# Patient Record
Sex: Female | Born: 1960 | Race: White | Hispanic: No | Smoking: Current every day smoker
Health system: Southern US, Community
[De-identification: ages and names within clinical notes are randomized; demographics above are authoritative.]

## PROBLEM LIST (undated history)

## (undated) DIAGNOSIS — M549 Dorsalgia, unspecified: Secondary | ICD-10-CM

## (undated) DIAGNOSIS — F4310 Post-traumatic stress disorder, unspecified: Secondary | ICD-10-CM

## (undated) DIAGNOSIS — M797 Fibromyalgia: Secondary | ICD-10-CM

## (undated) DIAGNOSIS — B009 Herpesviral infection, unspecified: Secondary | ICD-10-CM

## (undated) DIAGNOSIS — M1990 Unspecified osteoarthritis, unspecified site: Secondary | ICD-10-CM

## (undated) DIAGNOSIS — N879 Dysplasia of cervix uteri, unspecified: Secondary | ICD-10-CM

## (undated) DIAGNOSIS — G8929 Other chronic pain: Secondary | ICD-10-CM

## (undated) DIAGNOSIS — B029 Zoster without complications: Secondary | ICD-10-CM

## (undated) HISTORY — DX: Zoster without complications: B02.9

## (undated) HISTORY — DX: Unspecified osteoarthritis, unspecified site: M19.90

## (undated) HISTORY — DX: Fibromyalgia: M79.7

---

## 1989-11-10 HISTORY — PX: TUBAL LIGATION: SHX77

## 1991-11-11 HISTORY — PX: ABDOMINAL HYSTERECTOMY: SHX81

## 1999-07-15 ENCOUNTER — Emergency Department (HOSPITAL_COMMUNITY): Admission: EM | Admit: 1999-07-15 | Discharge: 1999-07-15 | Payer: Self-pay | Admitting: Emergency Medicine

## 2001-05-15 ENCOUNTER — Emergency Department (HOSPITAL_COMMUNITY): Admission: EM | Admit: 2001-05-15 | Discharge: 2001-05-15 | Payer: Self-pay | Admitting: Emergency Medicine

## 2001-09-13 ENCOUNTER — Ambulatory Visit (HOSPITAL_COMMUNITY): Admission: RE | Admit: 2001-09-13 | Discharge: 2001-09-13 | Payer: Self-pay | Admitting: General Surgery

## 2001-09-13 ENCOUNTER — Encounter: Payer: Self-pay | Admitting: General Surgery

## 2002-02-22 ENCOUNTER — Ambulatory Visit (HOSPITAL_COMMUNITY): Admission: RE | Admit: 2002-02-22 | Discharge: 2002-02-22 | Payer: Self-pay | Admitting: General Surgery

## 2002-02-22 ENCOUNTER — Encounter: Payer: Self-pay | Admitting: General Surgery

## 2004-02-05 ENCOUNTER — Emergency Department (HOSPITAL_COMMUNITY): Admission: EM | Admit: 2004-02-05 | Discharge: 2004-02-06 | Payer: Self-pay | Admitting: Emergency Medicine

## 2004-04-08 ENCOUNTER — Emergency Department (HOSPITAL_COMMUNITY): Admission: EM | Admit: 2004-04-08 | Discharge: 2004-04-09 | Payer: Self-pay | Admitting: *Deleted

## 2004-07-22 ENCOUNTER — Emergency Department (HOSPITAL_COMMUNITY): Admission: EM | Admit: 2004-07-22 | Discharge: 2004-07-22 | Payer: Self-pay | Admitting: Family Medicine

## 2006-04-17 ENCOUNTER — Ambulatory Visit (HOSPITAL_COMMUNITY): Admission: RE | Admit: 2006-04-17 | Discharge: 2006-04-17 | Payer: Self-pay | Admitting: Family Medicine

## 2006-08-03 ENCOUNTER — Emergency Department (HOSPITAL_COMMUNITY): Admission: EM | Admit: 2006-08-03 | Discharge: 2006-08-03 | Payer: Self-pay | Admitting: Emergency Medicine

## 2011-06-04 ENCOUNTER — Emergency Department (HOSPITAL_COMMUNITY)
Admission: EM | Admit: 2011-06-04 | Discharge: 2011-06-05 | Disposition: A | Discharge status: Home / Self Care | Payer: Self-pay | Attending: Emergency Medicine | Admitting: Emergency Medicine

## 2011-06-04 DIAGNOSIS — R5383 Other fatigue: Secondary | ICD-10-CM | POA: Insufficient documentation

## 2011-06-04 DIAGNOSIS — R195 Other fecal abnormalities: Secondary | ICD-10-CM | POA: Insufficient documentation

## 2011-06-04 DIAGNOSIS — R5381 Other malaise: Secondary | ICD-10-CM | POA: Insufficient documentation

## 2011-06-04 DIAGNOSIS — R110 Nausea: Secondary | ICD-10-CM | POA: Insufficient documentation

## 2011-06-04 DIAGNOSIS — T148 Other injury of unspecified body region: Secondary | ICD-10-CM | POA: Insufficient documentation

## 2011-06-04 DIAGNOSIS — R51 Headache: Secondary | ICD-10-CM | POA: Insufficient documentation

## 2011-06-04 DIAGNOSIS — R21 Rash and other nonspecific skin eruption: Secondary | ICD-10-CM | POA: Insufficient documentation

## 2011-06-04 DIAGNOSIS — W57XXXA Bitten or stung by nonvenomous insect and other nonvenomous arthropods, initial encounter: Secondary | ICD-10-CM | POA: Insufficient documentation

## 2011-06-05 ENCOUNTER — Emergency Department (HOSPITAL_COMMUNITY): Payer: Self-pay

## 2011-06-05 LAB — CBC
HCT: 37.6 % (ref 36.0–46.0)
Hemoglobin: 12.9 g/dL (ref 12.0–15.0)
RDW: 11.7 % (ref 11.5–15.5)
WBC: 6.2 10*3/uL (ref 4.0–10.5)

## 2011-06-05 LAB — URINALYSIS, ROUTINE W REFLEX MICROSCOPIC
Glucose, UA: NEGATIVE mg/dL
Hgb urine dipstick: NEGATIVE
Leukocytes, UA: NEGATIVE
Specific Gravity, Urine: 1.017 (ref 1.005–1.030)
pH: 6 (ref 5.0–8.0)

## 2011-06-05 LAB — COMPREHENSIVE METABOLIC PANEL
ALT: 17 U/L (ref 0–35)
Albumin: 4 g/dL (ref 3.5–5.2)
Alkaline Phosphatase: 81 U/L (ref 39–117)
BUN: 8 mg/dL (ref 6–23)
Chloride: 102 mEq/L (ref 96–112)
Potassium: 3.5 mEq/L (ref 3.5–5.1)
Sodium: 139 mEq/L (ref 135–145)
Total Bilirubin: 0.2 mg/dL — ABNORMAL LOW (ref 0.3–1.2)
Total Protein: 7.3 g/dL (ref 6.0–8.3)

## 2011-06-05 LAB — DIFFERENTIAL
Basophils Absolute: 0 10*3/uL (ref 0.0–0.1)
Basophils Relative: 1 % (ref 0–1)
Eosinophils Relative: 2 % (ref 0–5)
Lymphocytes Relative: 45 % (ref 12–46)
Neutro Abs: 3 10*3/uL (ref 1.7–7.7)

## 2011-06-05 LAB — LIPASE, BLOOD: Lipase: 31 U/L (ref 11–59)

## 2011-06-06 LAB — B. BURGDORFI ANTIBODIES: B burgdorferi Ab IgG+IgM: 0.33 {ISR}

## 2011-06-06 LAB — ROCKY MTN SPOTTED FVR AB, IGG-BLOOD: RMSF IgG: 0.45 IV

## 2011-06-06 LAB — ROCKY MTN SPOTTED FVR AB, IGM-BLOOD: RMSF IgM: 0.19 IV (ref 0.00–0.89)

## 2011-10-28 ENCOUNTER — Emergency Department (HOSPITAL_COMMUNITY)
Admission: EM | Admit: 2011-10-28 | Discharge: 2011-10-28 | Disposition: A | Discharge status: Home / Self Care | Payer: No Typology Code available for payment source | Attending: Emergency Medicine | Admitting: Emergency Medicine

## 2011-10-28 ENCOUNTER — Encounter: Payer: Self-pay | Admitting: Emergency Medicine

## 2011-10-28 ENCOUNTER — Emergency Department (HOSPITAL_COMMUNITY): Payer: No Typology Code available for payment source

## 2011-10-28 DIAGNOSIS — M62838 Other muscle spasm: Secondary | ICD-10-CM | POA: Insufficient documentation

## 2011-10-28 DIAGNOSIS — S161XXA Strain of muscle, fascia and tendon at neck level, initial encounter: Secondary | ICD-10-CM

## 2011-10-28 DIAGNOSIS — R209 Unspecified disturbances of skin sensation: Secondary | ICD-10-CM | POA: Insufficient documentation

## 2011-10-28 DIAGNOSIS — Y92410 Unspecified street and highway as the place of occurrence of the external cause: Secondary | ICD-10-CM | POA: Insufficient documentation

## 2011-10-28 DIAGNOSIS — S139XXA Sprain of joints and ligaments of unspecified parts of neck, initial encounter: Secondary | ICD-10-CM | POA: Insufficient documentation

## 2011-10-28 HISTORY — DX: Herpesviral infection, unspecified: B00.9

## 2011-10-28 MED ORDER — CYCLOBENZAPRINE HCL 10 MG PO TABS
5.0000 mg | ORAL_TABLET | Freq: Once | ORAL | Status: AC
  Administered 2011-10-28: 5 mg via ORAL
  Filled 2011-10-28 (×2): qty 1

## 2011-10-28 MED ORDER — CYCLOBENZAPRINE HCL 5 MG PO TABS: 5.0000 mg | ORAL_TABLET | Freq: Three times a day (TID) | ORAL | Status: DC | PRN

## 2011-10-28 MED ORDER — KETOROLAC TROMETHAMINE 60 MG/2ML IM SOLN
60.0000 mg | Freq: Once | INTRAMUSCULAR | Status: AC
  Administered 2011-10-28: 60 mg via INTRAMUSCULAR
  Filled 2011-10-28: qty 2

## 2011-10-28 MED ORDER — HYDROCODONE-ACETAMINOPHEN 5-325 MG PO TABS: 1.0000 | ORAL_TABLET | ORAL | Status: AC | PRN

## 2011-10-28 MED ORDER — HYDROCODONE-ACETAMINOPHEN 5-325 MG PO TABS
1.0000 | ORAL_TABLET | Freq: Once | ORAL | Status: AC
  Administered 2011-10-28: 1 via ORAL
  Filled 2011-10-28: qty 1

## 2011-10-28 NOTE — ED Notes (Signed)
Woke during this noc with neck and shoulder pain
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
## 2011-10-30 ENCOUNTER — Encounter (HOSPITAL_COMMUNITY): Payer: Self-pay | Admitting: Emergency Medicine

## 2011-10-30 ENCOUNTER — Emergency Department (HOSPITAL_COMMUNITY)
Admission: EM | Admit: 2011-10-30 | Discharge: 2011-10-30 | Disposition: A | Discharge status: Home / Self Care | Payer: No Typology Code available for payment source | Attending: Emergency Medicine | Admitting: Emergency Medicine

## 2011-10-30 DIAGNOSIS — Z9851 Tubal ligation status: Secondary | ICD-10-CM | POA: Insufficient documentation

## 2011-10-30 DIAGNOSIS — F4310 Post-traumatic stress disorder, unspecified: Secondary | ICD-10-CM | POA: Insufficient documentation

## 2011-10-30 DIAGNOSIS — S161XXA Strain of muscle, fascia and tendon at neck level, initial encounter: Secondary | ICD-10-CM

## 2011-10-30 DIAGNOSIS — F17200 Nicotine dependence, unspecified, uncomplicated: Secondary | ICD-10-CM | POA: Insufficient documentation

## 2011-10-30 DIAGNOSIS — S139XXA Sprain of joints and ligaments of unspecified parts of neck, initial encounter: Secondary | ICD-10-CM | POA: Insufficient documentation

## 2011-10-30 DIAGNOSIS — Z9079 Acquired absence of other genital organ(s): Secondary | ICD-10-CM | POA: Insufficient documentation

## 2011-10-30 DIAGNOSIS — B009 Herpesviral infection, unspecified: Secondary | ICD-10-CM | POA: Insufficient documentation

## 2011-10-30 DIAGNOSIS — Y92410 Unspecified street and highway as the place of occurrence of the external cause: Secondary | ICD-10-CM | POA: Insufficient documentation

## 2011-10-30 HISTORY — DX: Dysplasia of cervix uteri, unspecified: N87.9

## 2011-10-30 HISTORY — DX: Post-traumatic stress disorder, unspecified: F43.10

## 2011-10-30 MED ORDER — IBUPROFEN 800 MG PO TABS
800.0000 mg | ORAL_TABLET | Freq: Once | ORAL | Status: AC
  Administered 2011-10-30: 800 mg via ORAL
  Filled 2011-10-30: qty 1

## 2011-10-30 MED ORDER — IBUPROFEN 800 MG PO TABS: 800.0000 mg | ORAL_TABLET | Freq: Three times a day (TID) | ORAL | Status: AC

## 2011-10-30 MED ORDER — DIAZEPAM 5 MG PO TABS
5.0000 mg | ORAL_TABLET | Freq: Once | ORAL | Status: AC
  Administered 2011-10-30: 5 mg via ORAL
  Filled 2011-10-30: qty 1

## 2011-10-30 MED ORDER — HYDROCODONE-ACETAMINOPHEN 5-325 MG PO TABS
2.0000 | ORAL_TABLET | Freq: Once | ORAL | Status: AC
  Administered 2011-10-30: 2 via ORAL
  Filled 2011-10-30: qty 2

## 2011-10-30 MED ORDER — DIAZEPAM 5 MG PO TABS: 5.0000 mg | ORAL_TABLET | Freq: Every day | ORAL | Status: AC
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
## 2011-10-30 NOTE — ED Notes (Signed)
Ice given to apply to neck area.
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
## 2011-10-30 NOTE — ED Notes (Signed)
Patient reports taking vicidon and flexeril 2 hours ago with no relief.

## 2011-10-31 NOTE — ED Provider Notes (Signed)
Medical screening examination/treatment/procedure(s) were performed by non-physician practitioner and as supervising physician I was immediately available for consultation/collaboration.  Ashrita Chrismer K Linker, MD 10/31/11 1633 

## 2012-03-23 ENCOUNTER — Encounter (HOSPITAL_COMMUNITY): Payer: Self-pay | Admitting: *Deleted

## 2012-03-23 ENCOUNTER — Emergency Department (HOSPITAL_COMMUNITY): Payer: Self-pay

## 2012-03-23 ENCOUNTER — Emergency Department (HOSPITAL_COMMUNITY)
Admission: EM | Admit: 2012-03-23 | Discharge: 2012-03-23 | Disposition: A | Discharge status: Home / Self Care | Payer: Self-pay | Attending: Emergency Medicine | Admitting: Emergency Medicine

## 2012-03-23 DIAGNOSIS — M545 Low back pain, unspecified: Secondary | ICD-10-CM

## 2012-03-23 DIAGNOSIS — F17200 Nicotine dependence, unspecified, uncomplicated: Secondary | ICD-10-CM | POA: Insufficient documentation

## 2012-03-23 DIAGNOSIS — Z79899 Other long term (current) drug therapy: Secondary | ICD-10-CM | POA: Insufficient documentation

## 2012-03-23 HISTORY — DX: Other chronic pain: G89.29

## 2012-03-23 HISTORY — DX: Dorsalgia, unspecified: M54.9

## 2012-03-23 LAB — URINALYSIS, ROUTINE W REFLEX MICROSCOPIC
Glucose, UA: NEGATIVE mg/dL
Ketones, ur: NEGATIVE mg/dL
Leukocytes, UA: NEGATIVE
Protein, ur: NEGATIVE mg/dL
Urobilinogen, UA: 0.2 mg/dL (ref 0.0–1.0)

## 2012-03-23 MED ORDER — METHOCARBAMOL 500 MG PO TABS: 1000.0000 mg | ORAL_TABLET | Freq: Four times a day (QID) | ORAL | Status: AC | PRN

## 2012-03-23 MED ORDER — HYDROMORPHONE HCL PF 1 MG/ML IJ SOLN
1.0000 mg | Freq: Once | INTRAMUSCULAR | Status: AC
  Administered 2012-03-23: 1 mg via INTRAMUSCULAR
  Filled 2012-03-23: qty 1

## 2012-03-23 MED ORDER — HYDROCODONE-ACETAMINOPHEN 5-325 MG PO TABS: ORAL_TABLET | ORAL | Status: AC

## 2012-03-23 NOTE — ED Notes (Signed)
Pt reports having low back pain for 10 days, denies any known injury.

## 2012-03-23 NOTE — ED Notes (Signed)
Pain rt hip and low back, x 10 days

## 2012-03-23 NOTE — Discharge Instructions (Signed)
RESOURCE GUIDE  Dental Problems  Patients with Medicaid: [facility]                     [facility] [address]                                           [address] Phone:  [phone]                                                  Phone:  [phone]  If unable to pay or uninsured, contact:  [facility] or [facility] to become qualified for the adult dental clinic.  Chronic Pain Problems Contact [facility]  [phone] Patients need to be referred by their primary care doctor.  Insufficient Money for Medicine Contact [employer]:  call "211" or [facility] 271-5999.  No Primary Care Doctor Call [facility]  832-8000 Other agencies that provide inexpensive medical care    [facility]  [phone]    [facility]  [phone]    [facility]  [phone]    Women[facility] Clinic  832-4777    [facility]  [phone]    [facility]  [phone]  Psychological Services [facility]  [phone] [facility]  [phone] [facility]   800 853-5163 (emergency services 641-4993)  Substance Abuse Resources [facility]  336-882-2125 [facility] 336-784-9470 [facility] 336-285-9073 [facility] 336-845-3988 Residential & Outpatient Substance Abuse Program  800-659-3381  Abuse/Neglect [place] Child Abuse Hotline (336) 641-3795 [place] Child Abuse Hotline 800-378-5315 (After Hours)  Emergency Shelter [facility] (336) 271-5985  [facility] (336) 275-9566 [facility] (704) 372-4663  MRSA Hotline #:   832-7006    [place] [facility]  [facility] [place]     [employer]                          [facility] [address] [place]                       [address]      [address]  [place]                                                [place]                            [place] Phone:  [phone]                                   Phone:  [phone]                 Phone:  [phone]  [facility] Phone:  342-8316    [place] Child Abuse Hotline (336) 342-1394 (336) 342-3537 (After Hours)    Take the prescriptions as directed.  Apply moist heat or ice to the area(s) of discomfort, for 15 minutes at a time, several times per day for the next few days.  Do not fall asleep on a heating or ice pack.  Call your regular medical doctor today to schedule a follow up appointment within the next week.  Return to the Emergency Department immediately if worsening.  

## 2012-03-23 NOTE — ED Notes (Signed)
Report given to kristy rn

## 2012-03-23 NOTE — ED Provider Notes (Signed)
History     CSN: [id number]  Arrival date & time 03/23/12  1124   First MD Initiated Contact with Patient 03/23/12 1137      Chief Complaint  Patient presents with  . Back Pain     HPI Pt was seen at 1205.  Per pt, c/o gradual onset and persistence of constant right sided low back "pain" for the past 10 days. Pt states the pain is "lasting longer than usual" and she is unsure if this pain "isn't something else going on."  Describes the pain as "sharp."  Denies incont/retention of bowel or bladder, no saddle anesthesia, no focal motor weakness, no tingling/numbness in extremities, no fevers, no injury.   The symptoms have been associated with no other complaints. The patient has a significant history of similar symptoms previously, recently being evaluated for this complaint and multiple prior evals for same.     Past Medical History  Diagnosis Date  . Herpes     no outbreak in 5 years, gets fever blisters and lesions on buttocks  . Post traumatic stress disorder (PTSD)   . Endometriosis   . Precancerous changes of the cervix   . Chronic back pain     Past Surgical History  Procedure Date  . Abdominal hysterectomy   . Tubal ligation     Family History  Problem Relation Age of Onset  . Diabetes Father   . Heart failure Father   . Cancer Other     History  Substance Use Topics  . Smoking status: Current Everyday Smoker -- 1.0 packs/day for 40 years    Types: Cigarettes  . Smokeless tobacco: Never Used  . Alcohol Use: No    OB History    Grav Para Term Preterm Abortions TAB SAB Ect Mult Living   5 3 3  2  2   3       Review of Systems ROS: Statement: All systems negative except as marked or noted in the HPI; Constitutional: Negative for fever and chills. ; ; Eyes: Negative for eye pain, redness and discharge. ; ; ENMT: Negative for ear pain, hoarseness, nasal congestion, sinus pressure and sore throat. ; ; Cardiovascular: Negative for chest pain, palpitations,  diaphoresis, dyspnea and peripheral edema. ; ; Respiratory: Negative for cough, wheezing and stridor. ; ; Gastrointestinal: Negative for nausea, vomiting, diarrhea, abdominal pain, blood in stool, hematemesis, jaundice and rectal bleeding. . ; ; Genitourinary: Negative for dysuria, flank pain and hematuria. ; ; Musculoskeletal: +LBP.  Negative for neck pain. Negative for swelling and trauma.; ; Skin: Negative for pruritus, rash, abrasions, blisters, bruising and skin lesion.; ; Neuro: Negative for headache, lightheadedness and neck stiffness. Negative for weakness, altered level of consciousness , altered mental status, extremity weakness, paresthesias, involuntary movement, seizure and syncope.     Allergies  Review of patient's allergies indicates no known allergies.  Home Medications   Current Outpatient Rx  Name Route Sig Dispense Refill  . [place] RED ALGAE POWD Oral Take 1 capsule by mouth 2 (two) times daily.      . BC HEADACHE POWDER PO Oral Take 1 packet by mouth daily as needed. For pain.     Marland Kitchen VITAMIN B-6 PO Oral Take 1 tablet by mouth daily.        BP 102/62  Pulse 77  Temp(Src) 97.5 F (36.4 C) (Oral)  Resp 18  Ht 5\' 4"  (1.626 m)  Wt 160 lb (72.576 kg)  BMI 27.46 kg/m2  SpO2 99%  Physical Exam 1210: Physical examination:  Nursing notes reviewed; Vital signs and O2 SAT reviewed;  Constitutional: Well developed, Well nourished, Well hydrated, Uncomfortable appearing; Head:  Normocephalic, atraumatic; Eyes: EOMI, PERRL, No scleral icterus; ENMT: Mouth and pharynx normal, Mucous membranes moist; Neck: Supple, Full range of motion, No lymphadenopathy; Cardiovascular: Regular rate and rhythm, No gallop; Respiratory: Breath sounds clear & equal bilaterally, No wheezes, Normal respiratory effort/excursion; Chest: Nontender, Movement normal; Abdomen: Soft, Nontender, Nondistended, Normal bowel sounds; Genitourinary: No CVA tenderness; Spine:  No midline CS, TS, LS tenderness. +TTP  right lower lumbar paraspinal muscles; Extremities: Pulses normal, No tenderness, No edema, No calf edema or asymmetry.; Neuro: AA&Ox3, Major CN grossly intact. Strength 5/5 equal bilat UE's and LE's, including great toe dorsiflexion.  DTR 2/4 equal bilat UE's and LE's.  No gross sensory deficits.  Neg straight leg raises bilat..; Skin: Color normal, Warm, Dry, no rash.    ED Course  Procedures   1215:  Pt states this pain today is "different" from her usual chronic LBP.  States she took "an oxycodone" this morning that "didn't help."  Pt has multiple previous XR LS and MRI LS in [facility] with +DDD.  Will check CT scan, UA.   1:49 PM:  Appears acute flair of chronic pain today, will tx symptomatically.  States she has "run out of" her narcotic pain medications and is requesting rx for same.  Dx testing d/w pt and family.  Questions answered.  Verb understanding, agreeable to d/c home with outpt f/u with her PMD or pain management doctor for good continuity of care and control of her chronic pain.    MDM  MDM Reviewed: previous chart, nursing note and vitals Reviewed previous: MRI and x-ray Interpretation: CT scan and labs     Results for orders placed during the hospital encounter of 03/23/12  URINALYSIS, ROUTINE W REFLEX MICROSCOPIC      Component Value Range   Color, Urine YELLOW  YELLOW    APPearance CLEAR  CLEAR    Specific Gravity, Urine 1.010  1.005 - 1.030    pH 5.5  5.0 - 8.0    Glucose, UA NEGATIVE  NEGATIVE (mg/dL)   Hgb urine dipstick NEGATIVE  NEGATIVE    Bilirubin Urine NEGATIVE  NEGATIVE    Ketones, ur NEGATIVE  NEGATIVE (mg/dL)   Protein, ur NEGATIVE  NEGATIVE (mg/dL)   Urobilinogen, UA 0.2  0.0 - 1.0 (mg/dL)   Nitrite NEGATIVE  NEGATIVE    Leukocytes, UA NEGATIVE  NEGATIVE    Ct Abdomen Pelvis Wo Contrast 03/23/2012  *RADIOLOGY REPORT*  Clinical Data: Low back pain, concern for renal calculi  CT ABDOMEN AND PELVIS WITHOUT CONTRAST  Technique:  Multidetector CT imaging  of the abdomen and pelvis was performed following the standard protocol without intravenous contrast.  Comparison: No similar prior study is available for comparison.  Findings: Lung bases are clear.  Unenhanced abdominal viscera are normal in appearance.  No radiopaque renal or ureteral calculus.  No lymphadenopathy, free air, or free fluid.  The appendix is normal.  Bladder is distended but otherwise normal. Uterus and ovaries presumed surgically absent.  No bowel wall thickening or focal segmental dilatation.  No acute osseous abnormality.  IMPRESSION: No acute intra-abdominal or pelvic pathology.  Specifically, no radiopaque renal or ureteral calculus is identified.  Original Report Authenticated By: Harrel Lemon, M.D.           Laray Anger, DO 03/25/12 1058

## 2012-03-24 LAB — URINE CULTURE: Colony Count: 30000

## 2012-12-01 ENCOUNTER — Encounter (HOSPITAL_COMMUNITY): Payer: Self-pay | Admitting: *Deleted

## 2012-12-01 ENCOUNTER — Emergency Department (HOSPITAL_COMMUNITY): Payer: Self-pay

## 2012-12-01 ENCOUNTER — Emergency Department (HOSPITAL_COMMUNITY)
Admission: EM | Admit: 2012-12-01 | Discharge: 2012-12-01 | Disposition: A | Discharge status: Home / Self Care | Payer: Self-pay | Attending: Emergency Medicine | Admitting: Emergency Medicine

## 2012-12-01 DIAGNOSIS — Z8619 Personal history of other infectious and parasitic diseases: Secondary | ICD-10-CM | POA: Insufficient documentation

## 2012-12-01 DIAGNOSIS — Z8659 Personal history of other mental and behavioral disorders: Secondary | ICD-10-CM | POA: Insufficient documentation

## 2012-12-01 DIAGNOSIS — Z79899 Other long term (current) drug therapy: Secondary | ICD-10-CM | POA: Insufficient documentation

## 2012-12-01 DIAGNOSIS — G8929 Other chronic pain: Secondary | ICD-10-CM | POA: Insufficient documentation

## 2012-12-01 DIAGNOSIS — F17200 Nicotine dependence, unspecified, uncomplicated: Secondary | ICD-10-CM | POA: Insufficient documentation

## 2012-12-01 DIAGNOSIS — Y939 Activity, unspecified: Secondary | ICD-10-CM | POA: Insufficient documentation

## 2012-12-01 DIAGNOSIS — Y929 Unspecified place or not applicable: Secondary | ICD-10-CM | POA: Insufficient documentation

## 2012-12-01 DIAGNOSIS — S8263XA Displaced fracture of lateral malleolus of unspecified fibula, initial encounter for closed fracture: Secondary | ICD-10-CM | POA: Insufficient documentation

## 2012-12-01 DIAGNOSIS — Z87410 Personal history of cervical dysplasia: Secondary | ICD-10-CM | POA: Insufficient documentation

## 2012-12-01 DIAGNOSIS — W010XXA Fall on same level from slipping, tripping and stumbling without subsequent striking against object, initial encounter: Secondary | ICD-10-CM | POA: Insufficient documentation

## 2012-12-01 MED ORDER — HYDROCODONE-ACETAMINOPHEN 10-325 MG PO TABS: 1.0000 | ORAL_TABLET | Freq: Four times a day (QID) | ORAL | Status: DC | PRN

## 2012-12-01 MED ORDER — MELOXICAM 7.5 MG PO TABS: ORAL_TABLET | ORAL | Status: DC

## 2012-12-01 NOTE — ED Provider Notes (Signed)
History     CSN: [id number]  Arrival date & time 12/01/12  1130   First MD Initiated Contact with Patient 12/01/12 1139      Chief Complaint  Patient presents with  . Ankle Pain    (Consider location/radiation/quality/duration/timing/severity/associated sxs/prior treatment) HPI Comments: Pt sustained a fall on Jan. 3, 2014. She injured the left ankle. She tried ice, elevation, tylenol an ibuprofen. She used a cane. She continues to have pain and swelling of the left ankle. No previous hx of injury of problem with the left ankle.  She gets some relief from Norco 10mg , but continues to have pain when not taking the medications.  Patient is a 52 y.o. female presenting with ankle pain.  Ankle Pain     Past Medical History  Diagnosis Date  . Herpes     no outbreak in 5 years, gets fever blisters and lesions on buttocks  . Post traumatic stress disorder (PTSD)   . Endometriosis   . Precancerous changes of the cervix   . Chronic back pain     Past Surgical History  Procedure Date  . Abdominal hysterectomy   . Tubal ligation     Family History  Problem Relation Age of Onset  . Diabetes Father   . Heart failure Father   . Cancer Other     History  Substance Use Topics  . Smoking status: Current Every Day Smoker -- 1.0 packs/day for 40 years    Types: Cigarettes  . Smokeless tobacco: Never Used  . Alcohol Use: No    OB History    Grav Para Term Preterm Abortions TAB SAB Ect Mult Living   5 3 3  2  2   3       Review of Systems  Constitutional: Negative for activity change.       All ROS Neg except as noted in HPI  HENT: Negative for nosebleeds and neck pain.   Eyes: Negative for photophobia and discharge.  Respiratory: Negative for cough, shortness of breath and wheezing.   Cardiovascular: Negative for chest pain and palpitations.  Gastrointestinal: Negative for abdominal pain and blood in stool.  Genitourinary: Negative for dysuria, frequency and hematuria.    Musculoskeletal: Positive for back pain and arthralgias.  Skin: Negative.   Neurological: Negative for dizziness, seizures and speech difficulty.  Psychiatric/Behavioral: Negative for hallucinations and confusion.    Allergies  Review of patient's allergies indicates no known allergies.  Home Medications   Current Outpatient Rx  Name  Route  Sig  Dispense  Refill  . DIAZEPAM 5 MG PO TABS   Oral   Take 5 mg by mouth 3 (three) times daily as needed. Anxiety         . HYDROCODONE-ACETAMINOPHEN 10-325 MG PO TABS   Oral   Take 1 tablet by mouth every 6 (six) hours as needed. Back pain         . IBUPROFEN 200 MG PO TABS   Oral   Take 400 mg by mouth every 6 (six) hours as needed. Pain         . POLYETHYLENE GLYCOL 3350 PO PACK   Oral   Take 17 g by mouth daily as needed. constipation         . SENSITIVE EYES SALINE SOLN   Both Eyes   Place 1 drop into both eyes daily as needed. Dry Eyes         . VITAMIN C 500 MG PO TABS  Oral   Take 1,000 mg by mouth daily.           BP 113/58  Pulse 77  Temp 98 F (36.7 C) (Oral)  Resp 16  Ht 5\' 4"  (1.626 m)  Wt 160 lb (72.576 kg)  BMI 27.46 kg/m2  SpO2 98%  Physical Exam  Nursing note and vitals reviewed. Constitutional: She is oriented to person, place, and time. She appears well-developed and well-nourished.  Non-toxic appearance.  HENT:  Head: Normocephalic.  Right Ear: Tympanic membrane and external ear normal.  Left Ear: Tympanic membrane and external ear normal.  Eyes: EOM and lids are normal. Pupils are equal, round, and reactive to light.  Neck: Normal range of motion. Neck supple. Carotid bruit is not present.  Cardiovascular: Normal rate, regular rhythm, normal heart sounds, intact distal pulses and normal pulses.   Pulmonary/Chest: Breath sounds normal. No respiratory distress.  Abdominal: Soft. Bowel sounds are normal. There is no tenderness. There is no guarding.  Musculoskeletal: Normal range of  motion.       There is pain an mild swelling of the lateral malleolus on the left. Achilles intact. Cap refill less than 3 sec.  Lymphadenopathy:       Head (right side): No submandibular adenopathy present.       Head (left side): No submandibular adenopathy present.    She has no cervical adenopathy.  Neurological: She is alert and oriented to person, place, and time. She has normal strength. No cranial nerve deficit or sensory deficit.  Skin: Skin is warm and dry.  Psychiatric: She has a normal mood and affect. Her speech is normal.    ED Course  Procedures : FRACTURE CARE - patient is a 52 year old female who sustained a fall on January 3. She has been trying home remedies for the symptoms, but the problem seems to be getting worse. X-ray of the left ankle reveals a avulsion fracture of the lateral malleolus with adjacent soft tissue swelling.  The patient was educated on the fracture. The plan was discussed in terms which he understood.  After finding that there was no neurovascular compromise, the patient was fitted with an ankle stirrup splint. The patient had excellent capillary refill after the splint was applied. The patient was then fitted with crutches. The patient was instructed to use the splint for the next 10 days. Patient was also instructed to use the splint if having a high level of activity after the ten-day period for support. The patient is to use the crutches until she can safely apply weight to the ankle. She was given a prescription for Norco and Mobic. She's been given instructions that the Mobic may cause drowsiness or lightheadedness. The patient is to return if any changes, problems, or concerns.  Labs Reviewed - No data to display Dg Ankle Complete Left  12/01/2012  *RADIOLOGY REPORT*  Clinical Data: Fall 11/12/2012.  Pain and swelling since.  LEFT ANKLE COMPLETE - 3+ VIEW  Comparison: None.  Findings: Soft tissue swelling lateral malleolar region. Spur of the distal  lateral malleolus.  Additionally, on one-view, the possibility of a tiny avulsion fracture is raised although difficult to confirm on other views.  No other findings of fracture or dislocation.  Spur Achilles tendon insertion site.  IMPRESSION: Question tiny avulsion fracture lateral malleolus with adjacent soft tissue swelling as noted above.   Original Report Authenticated By: Lacy Duverney, M.D.      No diagnosis found.    MDM  I  have reviewed nursing notes, vital signs, and all appropriate lab and imaging results for this patient. Xray of the left ankle reveals a small avulsion fx of the lateral malleolus. Pt fitted with crutches and ASO. Pt to use mobic and norco for pain.       Kathie Dike, PA 12/01/12 1245  Kathie Dike, [place] 12/01/12 1306

## 2012-12-01 NOTE — ED Provider Notes (Signed)
Medical screening examination/treatment/procedure(s) were performed by non-physician practitioner and as supervising physician I was immediately available for consultation/collaboration.   Anae Hams L Arul Farabee, MD 12/01/12 1407 

## 2012-12-01 NOTE — ED Notes (Signed)
Pt reports tripped and fell Jan 3rd.  C/o pain in left ankle since.  Pt has palpable pedal pulse and can wiggle toes.  Color and cap refill WNL.  Swelling noted to lateral ankle.

## 2012-12-01 NOTE — ED Notes (Signed)
Pt tripped and fell Jan 3rd. Pain to left medial ankle.

## 2014-09-11 ENCOUNTER — Encounter (HOSPITAL_COMMUNITY): Payer: Self-pay | Admitting: *Deleted

## 2015-05-31 ENCOUNTER — Encounter (HOSPITAL_COMMUNITY): Payer: Self-pay | Admitting: Emergency Medicine

## 2015-05-31 ENCOUNTER — Emergency Department (INDEPENDENT_AMBULATORY_CARE_PROVIDER_SITE_OTHER)
Admission: EM | Admit: 2015-05-31 | Discharge: 2015-05-31 | Disposition: A | Discharge status: Home / Self Care | Payer: Self-pay | Source: Home / Self Care | Attending: Emergency Medicine | Admitting: Emergency Medicine

## 2015-05-31 DIAGNOSIS — B029 Zoster without complications: Secondary | ICD-10-CM

## 2015-05-31 MED ORDER — ACYCLOVIR 800 MG PO TABS: 800.0000 mg | ORAL_TABLET | Freq: Every day | ORAL | Status: DC

## 2015-05-31 MED ORDER — HYDROCODONE-ACETAMINOPHEN 7.5-325 MG PO TABS
1.0000 | ORAL_TABLET | ORAL | Status: DC | PRN
Start: 2015-05-31 — End: 2015-09-06

## 2015-05-31 NOTE — Discharge Instructions (Signed)

## 2015-05-31 NOTE — ED Provider Notes (Signed)
CSN: [id number]     Arrival date & time 05/31/15  1855 History   First MD Initiated Contact with Patient 05/31/15 1929     Chief Complaint  Patient presents with  . Rash  . Shoulder Pain   (Consider location/radiation/quality/duration/timing/severity/associated sxs/prior Treatment) HPI Comments: 54 year old female complaining of a pain in the right upper to mid back along with a rash that developed 2-3 days ago. Approximately 4 days ago she developed a burning and stinging in this area and then a couple days ago she noticed a red sore tender rash. She states the pain is a deep pain and feels like burning. It initially started out as an itching prior to the development of the rash.   Past Medical History  Diagnosis Date  . Herpes     no outbreak in 5 years, gets fever blisters and lesions on buttocks  . Post traumatic stress disorder (PTSD)   . Endometriosis   . Precancerous changes of the cervix   . Chronic back pain    Past Surgical History  Procedure Laterality Date  . Abdominal hysterectomy    . Tubal ligation     Family History  Problem Relation Age of Onset  . Diabetes Father   . Heart failure Father   . Cancer Other    History  Substance Use Topics  . Smoking status: Current Every Day Smoker -- 1.00 packs/day for 40 years    Types: Cigarettes  . Smokeless tobacco: Never Used  . Alcohol Use: No   OB History    Gravida Para Term Preterm AB TAB SAB Ectopic Multiple Living   5 3 3  2  2   3      Review of Systems  Constitutional: Negative.   HENT: Negative.   Respiratory: Negative.   Genitourinary: Negative.   Skin: Positive for rash.  Neurological: Negative.   All other systems reviewed and are negative.   Allergies  Review of patient's allergies indicates no known allergies.  Home Medications   Prior to Admission medications   Medication Sig Start Date End Date Taking? Authorizing Provider  acyclovir (ZOVIRAX) 800 MG tablet Take 1 tablet (800 mg total)  by mouth 5 (five) times daily. X 10 days 05/31/15   Hayden Rasmussen, NP  diazepam (VALIUM) 5 MG tablet Take 5 mg by mouth 3 (three) times daily as needed. Anxiety    Historical Provider, MD  HYDROcodone-acetaminophen (NORCO) 7.5-325 MG per tablet Take 1 tablet by mouth every 4 (four) hours as needed. 05/31/15   Hayden Rasmussen, NP  ibuprofen (ADVIL,MOTRIN) 200 MG tablet Take 400 mg by mouth every 6 (six) hours as needed. Pain    Historical Provider, MD  meloxicam (MOBIC) 7.5 MG tablet 1 po bid with food 12/01/12   Ivery Quale, PA-C  polyethylene glycol (MIRALAX / GLYCOLAX) packet Take 17 g by mouth daily as needed. constipation    Historical Provider, MD  Soft Lens Products (SENSITIVE EYES SALINE) SOLN Place 1 drop into both eyes daily as needed. Dry Eyes    Historical Provider, MD  vitamin C (ASCORBIC ACID) 500 MG tablet Take 1,000 mg by mouth daily.    Historical Provider, MD   BP 133/76 mmHg  Pulse 91  Temp(Src) 98.4 F (36.9 C) (Oral)  SpO2 95% Physical Exam  Constitutional: She is oriented to person, place, and time. She appears well-developed and well-nourished. No distress.  Neck: Normal range of motion. Neck supple.  Pulmonary/Chest: Effort normal. No respiratory distress.  Neurological: She  is alert and oriented to person, place, and time. No cranial nerve deficit.  Skin: Skin is warm and dry.  Scattered red papular vesicular rash to the right parathoracic area along dermatome T5, 6 and 7. The rash starts at the midline of the back and extends to the right approximately 8-10 cm.  Psychiatric: She has a normal mood and affect.  Nursing note and vitals reviewed.   ED Course  Procedures (including critical care time) Labs Review Labs Reviewed - No data to display  Imaging Review No results found.   MDM   1. Shingles    norco 7.5 mg #20 Acyclovir 800 mg  See your dr next week    Hayden Rasmussen, NP 05/31/15 2000

## 2015-05-31 NOTE — ED Notes (Signed)
Pt comes in with c/o right shoulder pain, denies injury or strain C/o itchy rash on R side of back, started Sunday Tried cold/ heat and Advil for pain without relief

## 2015-06-01 NOTE — ED Notes (Signed)
Patient called in stating that shingles has spread under boobs Spoke with provider on rx for Stacy Master, NP is aware

## 2015-06-01 NOTE — ED Notes (Signed)
Responded to pt request per provider Onalee Hua Mabe,NP verbal order: Prescription called in to [facility] Lidoderm patches #12, no refills Apply to affected area Q12 hrs on and 12 hours off

## 2015-06-12 ENCOUNTER — Encounter (HOSPITAL_COMMUNITY): Payer: Self-pay | Admitting: Emergency Medicine

## 2015-06-12 ENCOUNTER — Emergency Department (INDEPENDENT_AMBULATORY_CARE_PROVIDER_SITE_OTHER)
Admission: EM | Admit: 2015-06-12 | Discharge: 2015-06-12 | Disposition: A | Discharge status: Home / Self Care | Payer: Self-pay | Source: Home / Self Care | Attending: Family Medicine | Admitting: Family Medicine

## 2015-06-12 DIAGNOSIS — B0229 Other postherpetic nervous system involvement: Secondary | ICD-10-CM

## 2015-06-12 MED ORDER — CAPSAICIN 0.075 % EX CREA
1.0000 | TOPICAL_CREAM | Freq: Three times a day (TID) | CUTANEOUS | Status: DC
Start: 2015-06-12 — End: 2015-09-06

## 2015-06-12 MED ORDER — GABAPENTIN 300 MG PO CAPS: 300.0000 mg | ORAL_CAPSULE | Freq: Three times a day (TID) | ORAL | Status: DC

## 2015-06-12 NOTE — Discharge Instructions (Signed)
Use medicine as prescribed and see your doctor if further problems °

## 2015-06-12 NOTE — ED Notes (Signed)
Here for follow up States she feels as if shingles is spreading Has finished meds

## 2015-06-12 NOTE — ED Provider Notes (Signed)
CSN: [id number]     Arrival date & time 06/12/15  1742 History   First MD Initiated Contact with Patient 06/12/15 1834     Chief Complaint  Patient presents with  . Follow-up   (Consider location/radiation/quality/duration/timing/severity/associated sxs/prior Treatment) Patient is a 54 y.o. female presenting with rash. The history is provided by the patient.  Rash Location:  Torso Torso rash location:  R chest Quality: burning and painful   Pain details:    Severity:  Moderate   Onset quality:  Gradual   Duration: seen 7/21 for shingles on right t8 derm, rash resolving but still with skin sensitivity.   Timing:  Constant   Past Medical History  Diagnosis Date  . Herpes     no outbreak in 5 years, gets fever blisters and lesions on buttocks  . Post traumatic stress disorder (PTSD)   . Endometriosis   . Precancerous changes of the cervix   . Chronic back pain    Past Surgical History  Procedure Laterality Date  . Abdominal hysterectomy    . Tubal ligation     Family History  Problem Relation Age of Onset  . Diabetes Father   . Heart failure Father   . Cancer Other    History  Substance Use Topics  . Smoking status: Current Every Day Smoker -- 1.00 packs/day for 40 years    Types: Cigarettes  . Smokeless tobacco: Never Used  . Alcohol Use: No   OB History    Gravida Para Term Preterm AB TAB SAB Ectopic Multiple Living   Review of Systems  Constitutional: Negative.   Cardiovascular: Positive for chest pain.  Skin: Positive for rash.    Allergies  Review of patient's allergies indicates no known allergies.  Home Medications   Prior to Admission medications   Medication Sig Start Date End Date Taking? Authorizing Provider  acyclovir (ZOVIRAX) 800 MG tablet Take 1 tablet (800 mg total) by mouth 5 (five) times daily. X 10 days 05/31/15   Hayden Rasmussen, NP  capsicum (ZOSTRIX) 0.075 % topical cream Apply 1 application topically 3 (three) times  daily. 06/12/15   Linna Hoff, MD  diazepam (VALIUM) 5 MG tablet Take 5 mg by mouth 3 (three) times daily as needed. Anxiety    Historical Provider, MD  gabapentin (NEURONTIN) 300 MG capsule Take 1 capsule (300 mg total) by mouth 3 (three) times daily. For rash pain 06/12/15   Linna Hoff, MD  HYDROcodone-acetaminophen (NORCO) 7.5-325 MG per tablet Take 1 tablet by mouth every 4 (four) hours as needed. 05/31/15   Hayden Rasmussen, NP  ibuprofen (ADVIL,MOTRIN) 200 MG tablet Take 400 mg by mouth every 6 (six) hours as needed. Pain    Historical Provider, MD  meloxicam (MOBIC) 7.5 MG tablet 1 po bid with food 12/01/12   Ivery Quale, PA-C  polyethylene glycol (MIRALAX / GLYCOLAX) packet Take 17 g by mouth daily as needed. constipation    Historical Provider, MD  Soft Lens Products (SENSITIVE EYES SALINE) SOLN Place 1 drop into both eyes daily as needed. Dry Eyes    Historical Provider, MD  vitamin C (ASCORBIC ACID) 500 MG tablet Take 1,000 mg by mouth daily.    Historical Provider, MD   BP 127/92 mmHg  Pulse 71  Temp(Src) 98.2 F (36.8 C) (Oral)  Resp 16  SpO2 98% Physical Exam  Constitutional: She appears well-developed and well-nourished. She appears  distressed.  Neck: Normal range of motion. Neck supple.  Pulmonary/Chest: Effort normal and breath sounds normal. She exhibits tenderness.  Abdominal: Soft. Bowel sounds are normal.  Skin: Skin is warm and dry. Rash noted.  Nearly resolved t8 right shingles dermatitis, uncomplicated.  Nursing note and vitals reviewed.   ED Course  Procedures (including critical care time) Labs Review Labs Reviewed - No data to display  Imaging Review No results found.   MDM   1. HZV (herpes zoster virus) post herpetic neuralgia        Linna Hoff, MD 06/12/15 [phone]

## 2015-09-06 ENCOUNTER — Encounter: Payer: Self-pay | Admitting: Obstetrics and Gynecology

## 2015-09-06 ENCOUNTER — Ambulatory Visit (INDEPENDENT_AMBULATORY_CARE_PROVIDER_SITE_OTHER): Payer: Self-pay | Admitting: Obstetrics and Gynecology

## 2015-09-06 VITALS — BP 140/86 | Ht 63.0 in | Wt 176.0 lb

## 2015-09-06 DIAGNOSIS — L292 Pruritus vulvae: Secondary | ICD-10-CM

## 2015-09-06 DIAGNOSIS — Z0000 Encounter for general adult medical examination without abnormal findings: Secondary | ICD-10-CM | POA: Insufficient documentation

## 2015-09-06 DIAGNOSIS — M549 Dorsalgia, unspecified: Secondary | ICD-10-CM

## 2015-09-06 DIAGNOSIS — Z01419 Encounter for gynecological examination (general) (routine) without abnormal findings: Secondary | ICD-10-CM

## 2015-09-06 DIAGNOSIS — G8929 Other chronic pain: Secondary | ICD-10-CM | POA: Insufficient documentation

## 2015-09-06 DIAGNOSIS — Z90710 Acquired absence of both cervix and uterus: Secondary | ICD-10-CM

## 2015-09-06 MED ORDER — HYDROCODONE-ACETAMINOPHEN 7.5-325 MG PO TABS: 1.0000 | ORAL_TABLET | Freq: Four times a day (QID) | ORAL | Status: DC | PRN

## 2015-09-06 NOTE — Progress Notes (Signed)
Patient ID: Stacy Joyce, female   DOB: 1961-02-08, 54 y.o.   MRN: [id number]  Assessment:  Annual Gyn Exam   Plan:  1. Pap not done s/p hysterectomy 2. return annually or prn 3. Annual mammogram advised 4. Advised to f/u with PCP for mgmt of chronic low back pain and to see Chiropracter. Will give rx for 1 month course of Norco bid  Subjective:  Stacy Joyce is a 54 y.o. female [phone] who presents for annual exam. No LMP recorded. Patient has had a hysterectomy. The patient has complaints today of: She states she has recently been diagnosed with shingles and is still having residual pain. She refuses any medication at this time. She has tried Neurontin and other treatments. She has not had a mammogram in 5 years. She denies any urinary symptoms, problems with bowel movements or incontinence. She is not currently sexually active. She states that she is currently having tailbone pain that radiates down into her bilateral hips and into her legs that is worse with movement. She states she has been seeing a Land and has taken Norco in the past with relief. She denies any known injury or trauma to the area.   The following portions of the patient's history were reviewed and updated as appropriate: allergies, current medications, past family history, past medical history, past social history, past surgical history and problem list. Past Medical History  Diagnosis Date  . Herpes     no outbreak in 5 years, gets fever blisters and lesions on buttocks  . Post traumatic stress disorder (PTSD)   . Endometriosis   . Precancerous changes of the cervix   . Chronic back pain   . Fibromyalgia   . Shingles     Past Surgical History  Procedure Laterality Date  . Abdominal hysterectomy    . Tubal ligation       Current outpatient prescriptions:  .  HYDROcodone-acetaminophen (NORCO) 7.5-325 MG per tablet, Take 1 tablet by mouth every 4 (four) hours as needed., Disp: 20 tablet, Rfl: 0 .   ibuprofen (ADVIL,MOTRIN) 200 MG tablet, Take 400 mg by mouth every 6 (six) hours as needed. Pain, Disp: , Rfl:  .  polyethylene glycol (MIRALAX / GLYCOLAX) packet, Take 17 g by mouth daily as needed. constipation, Disp: , Rfl:  .  Soft Lens Products (SENSITIVE EYES SALINE) SOLN, Place 1 drop into both eyes daily as needed. Dry Eyes, Disp: , Rfl:  .  diazepam (VALIUM) 5 MG tablet, Take 5 mg by mouth 3 (three) times daily as needed. Anxiety, Disp: , Rfl:   Review of Systems Constitutional: negative Gastrointestinal: negative  Genitourinary: negative  Musk: positive for low back pain  Objective:  BP 140/86 mmHg  Ht  (1.6 m)  Wt 176 lb (79.833 kg)  BMI 31.18 kg/m2   BMI: Body mass index is 31.18 kg/(m^2).  General Appearance: Alert, appropriate appearance for age. No acute distress Back: No CVAT Breast Exam: No dimpling, nipple retraction or discharge. No masses or nodes. Tissue moves around easily Pelvic Exam: Vulva and vagina appear normal. Vaginal tissues are thinned out. No palpable masses External genitalia: normal general appearance Cervix: removed surgically Adnexa: removed surgically Uterus: removed surgically  Rectovaginal: normal rectal, no masses and guaiac negative stool obtained Skin: no rash or abnormalities  Neurologic: Normal gait and speech, no tremor  Psychiatric: Alert and oriented, appropriate affect.  Urinalysis:Not done  Stacy Joyce. MD Pgr [phone] 12:06 PM  By signing my name below,  I, Stacy Morganaylor Eliakim Tendler, attest that this documentation has been prepared under the direction and in the presence of Tilda BurrowJohn Zymiere Trostle V, MD. Electronically Signed: Jarvis Morganaylor Ivette Joyce, ED Scribe. 09/06/2015. 12:22 PM.  I personally performed the services described in this documentation, which was SCRIBED in my presence. The recorded information has been reviewed and considered accurate. It has been edited as necessary during review. Tilda BurrowFERGUSON,Dniya Neuhaus V, MD

## 2015-09-06 NOTE — Progress Notes (Signed)
Patient ID: Stacy Joyce, female   DOB: 06/15/1961, 54 y.o.   MRN: [id number] Pt here today for annual exam. Pt states that she has had a complete hysterectomy. Pt states that she has some vaginal itching and has tried OTC medications with no relief.

## 2015-10-01 ENCOUNTER — Telehealth: Payer: Self-pay | Admitting: *Deleted

## 2015-10-01 NOTE — Telephone Encounter (Signed)
Pt informed that i will not consider assuming more of a role on her care except thru evaluation of problems and full pt assessment.

## 2015-11-29 ENCOUNTER — Emergency Department (HOSPITAL_COMMUNITY)
Admission: EM | Admit: 2015-11-29 | Discharge: 2015-11-29 | Discharge status: Absent without leave | Payer: Self-pay | Source: Home / Self Care

## 2016-11-18 ENCOUNTER — Emergency Department (HOSPITAL_COMMUNITY): Payer: Self-pay

## 2016-11-18 ENCOUNTER — Emergency Department (HOSPITAL_COMMUNITY)
Admission: EM | Admit: 2016-11-18 | Discharge: 2016-11-18 | Disposition: A | Discharge status: Home / Self Care | Payer: Self-pay | Attending: Emergency Medicine | Admitting: Emergency Medicine

## 2016-11-18 ENCOUNTER — Encounter (HOSPITAL_COMMUNITY): Payer: Self-pay

## 2016-11-18 DIAGNOSIS — Y929 Unspecified place or not applicable: Secondary | ICD-10-CM | POA: Insufficient documentation

## 2016-11-18 DIAGNOSIS — F17210 Nicotine dependence, cigarettes, uncomplicated: Secondary | ICD-10-CM | POA: Insufficient documentation

## 2016-11-18 DIAGNOSIS — Y999 Unspecified external cause status: Secondary | ICD-10-CM | POA: Insufficient documentation

## 2016-11-18 DIAGNOSIS — W01198A Fall on same level from slipping, tripping and stumbling with subsequent striking against other object, initial encounter: Secondary | ICD-10-CM | POA: Insufficient documentation

## 2016-11-18 DIAGNOSIS — M25552 Pain in left hip: Secondary | ICD-10-CM | POA: Insufficient documentation

## 2016-11-18 DIAGNOSIS — Y939 Activity, unspecified: Secondary | ICD-10-CM | POA: Insufficient documentation

## 2016-11-18 MED ORDER — TRAMADOL HCL 50 MG PO TABS: 50.0000 mg | ORAL_TABLET | Freq: Four times a day (QID) | ORAL | 0 refills | Status: DC | PRN

## 2016-11-18 MED ORDER — HYDROCODONE-ACETAMINOPHEN 5-325 MG PO TABS
1.0000 | ORAL_TABLET | Freq: Once | ORAL | Status: AC
  Administered 2016-11-18: 1 via ORAL
  Filled 2016-11-18: qty 1

## 2016-11-18 NOTE — Discharge Instructions (Signed)
Her x-rays did not show any acute fractures. You have increased degenerative changes in her low back. I have given you a small course of pain medicine. You need to continue to take ibuprofen and Tylenol. I am giving you a referral to the orthopedist for follow-up. Return to the ED if you develops any worsening symptoms.

## 2016-11-18 NOTE — ED Provider Notes (Signed)
[facility]EMERGENCY DEPT Provider Note   CSN: [id number] Arrival date & time: 11/18/16  1246  By signing my name below, I, Stacy Joyce, attest that this documentation has been prepared under the direction and in the presence of [name], PA-C . Electronically Signed: Majel Joyce, Scribe. 11/18/2016. 4:02 PM.  History   Chief Complaint Chief Complaint  Patient presents with  . Hip Pain   The history is provided by the patient. No language interpreter was used.   HPI Comments: Stacy Joyce is a 56 y.o. female with PMHx of chronic back pain, arthritis, fibromyalgia, and shingles, who presents to the Emergency Department complaining of gradually worsening, left hip pain s/p a fall that occurred 2 weeks ago. Pt reports she "slipped" while getting out of the shower when she suddenly struck her knee against the toilet and her left hip against the shower. She denies any head injury or loss of consciousness. She notes her knee pain has completely resolved now in the ED. She states she has used [employer] product/process development and [employer] and applied ice and heat to her hip with no relief. Pt reports she is able to ambulate without difficulty; however, she states she has been "favoring the outside of her left foot" to ambulate due to pain. She notes her PCP, Dr. [name], recently closed his practice which is why she visited the ED today. She denies any other complaints including weakness of the lower extremities, paresthesias, loss of bowel or bladder, urinary retention, saddle paresthesias, fever, history of IV drug use, history of cancer.  Past Medical History:  Diagnosis Date  . Chronic back pain   . Endometriosis   . Fibromyalgia   . Herpes    no outbreak in 5 years, gets fever blisters and lesions on buttocks  . Post traumatic stress disorder (PTSD)   . Precancerous changes of the cervix   . Shingles     Patient Active Problem List   Diagnosis Date Noted  . Annual physical exam 09/06/2015  . Back pain, chronic  09/06/2015    Past Surgical History:  Procedure Laterality Date  . ABDOMINAL HYSTERECTOMY    . TUBAL LIGATION      OB History    Gravida Para Term Preterm AB Living   5 3 3   2 3    SAB TAB Ectopic Multiple Live Births   2             Home Medications    Prior to Admission medications   Medication Sig Start Date End Date Taking? Authorizing Provider  diazepam (VALIUM) 5 MG tablet Take 5 mg by mouth 3 (three) times daily as needed. Anxiety    Historical Provider, MD  HYDROcodone-acetaminophen (NORCO) 7.5-325 MG tablet Take 1 tablet by mouth every 6 (six) hours as needed for moderate pain. 09/06/15   [name], MD  ibuprofen (ADVIL,MOTRIN) 200 MG tablet Take 400 mg by mouth every 6 (six) hours as needed. Pain    Historical Provider, MD  polyethylene glycol (MIRALAX / GLYCOLAX) packet Take 17 g by mouth daily as needed. constipation    Historical Provider, MD  Soft Lens Products (SENSITIVE EYES SALINE) SOLN Place 1 drop into both eyes daily as needed. Dry Eyes    Historical Provider, MD    Family History Family History  Problem Relation Age of Onset  . Diabetes Father   . Heart failure Father   . Alzheimer's disease Mother   . Cancer Other     Social History  Social History  Substance Use Topics  . Smoking status: Current Every Day Smoker    Packs/day: 1.00    Years: 46.00    Types: Cigarettes  . Smokeless tobacco: Never Used     Comment: Pt used to smoke 2-3 packs a day but now only smokes 1 a day  . Alcohol use No     Comment: occ.     Allergies   Patient has no known allergies.   Review of Systems Review of Systems  Constitutional: Negative for chills and fever.  HENT: Negative for congestion.   Gastrointestinal: Negative for abdominal pain, nausea and vomiting.  Musculoskeletal: Positive for arthralgias and gait problem (walking with a limp). Negative for back pain and joint swelling.  Skin: Negative for wound.  Neurological: Negative for dizziness,  syncope, weakness, light-headedness, numbness and headaches.  All other systems reviewed and are negative.  Physical Exam Updated Vital Signs BP 106/92 (BP Location: Right Arm)   Pulse 102   Temp 98.2 F (36.8 C) (Oral)   Resp 18   Ht 5\' 4"  (1.626 m)   Wt 181 lb (82.1 kg)   SpO2 98%   BMI 31.07 kg/m   Physical Exam  Constitutional: She is oriented to person, place, and time. She appears well-developed and well-nourished.  HENT:  Head: Normocephalic and atraumatic.  Eyes: Conjunctivae and EOM are normal. Pupils are equal, round, and reactive to light.  Neck: Normal range of motion. Neck supple.  No midline C-spine tenderness. No deformities or step-offs noted.  Cardiovascular: Intact distal pulses.   Pulmonary/Chest: Effort normal.  Abdominal: She exhibits no distension.  Musculoskeletal: Normal range of motion.       Left hip: She exhibits tenderness and bony tenderness. She exhibits normal range of motion, normal strength, no swelling, no crepitus and no deformity.  DP pulses are 2+ bilaterally. Sensation intact. Cap refill normal. Patient is able to ambulate with normal gait. She has tenderness over the left SI joint. No ecchymosis, edema, erythema noted. Strength is 5 out of 5 in lower extremities. She has no midline T-spine or L-spine tenderness. No paraspinal muscular tenderness. No deformity or step-offs noted. Patient with full range of motion. Patient without any pain over the left knee or left ankle.  Neurological: She is alert and oriented to person, place, and time.  Skin: Skin is warm and dry. Capillary refill takes less than 2 seconds.  Psychiatric: She has a normal mood and affect.  Nursing note and vitals reviewed.  ED Treatments / Results  Labs (all labs ordered are listed, but only abnormal results are displayed) Labs Reviewed - No data to display  EKG  EKG Interpretation None       Radiology Dg Lumbar Spine Complete  Result Date: 11/18/2016 CLINICAL  DATA:  Larey SeatFell 3 weeks ago with left posterior hip pain radiating to the left leg EXAM: LUMBAR SPINE - COMPLETE 4+ VIEW COMPARISON:  CT abdomen and pelvis of 03/23/2012 FINDINGS: There has been some progression of degenerative disc disease at L5-S1 with further loss of intervertebral disc space and mild sclerosis. Otherwise disc spaces appear normal. No compression deformity is seen. The SI joints are corticated. IMPRESSION: 1. No acute compression deformity. 2. Progression of degenerative disc disease at L5-S1. Electronically Signed   By: Dwyane DeePaul  Barry M.D.   On: 11/18/2016 16:03   Dg Hip Unilat W Or Wo Pelvis 2-3 Views Left  Result Date: 11/18/2016 CLINICAL DATA:  Larey SeatFell 3 weeks ago with left posterior hip  pain radiating to the leg EXAM: DG HIP (WITH OR WITHOUT PELVIS) 2-3V LEFT COMPARISON:  None. FINDINGS: Both hip joint spaces appear normal for age. No fracture is seen. No significant degenerative change is noted. The pelvic rami are intact. The SI joints are corticated and the sacral foramina appear normal. IMPRESSION: Negative. Electronically Signed   By: Dwyane Dee M.D.   On: 11/18/2016 16:01    Procedures Procedures (including critical care time)  Medications Ordered in ED Medications  HYDROcodone-acetaminophen (NORCO/VICODIN) 5-325 MG per tablet 1 tablet (1 tablet Oral Given 11/18/16 1625)    DIAGNOSTIC STUDIES:  Oxygen Saturation is 98% on RA, normal by my interpretation.    COORDINATION OF CARE:  4:00 PM Discussed treatment plan with pt at bedside and pt agreed to plan.  Initial Impression / Assessment and Plan / ED Course  I have reviewed the triage vital signs and the nursing notes.  Pertinent labs & imaging results that were available during my care of the patient were reviewed by me and considered in my medical decision making (see chart for details).  Clinical Course   Patient presents to the ED with left hip pain after a fall 2 weeks ago. Patient denies LOC or hitting her head.  Patient with chronic back pain. Patient is ambulatory with mild limp. She has full range of motion. No focal neuro deficits. X-ray left hip without any acute changes. Given symptoms have been persistent for 2 weeks doubt occult fracture. Patient does have mild increase in degenerative changes of the lumbar region. Patient denies any back pain.  Patient can walk but states is painful.  No loss of bowel or bladder control.  No concern for cauda equina.  No fever, night sweats, weight loss, h/o cancer, IVDU.  RICE protocol and pain medicine indicated and discussed with patient. Patient encouraged to follow up with her primary care doctor. Given her a referral to orthopedist as well. Pt is hemodynamically stable, in NAD, & able to ambulate in the ED. Pain has been managed & has no complaints prior to dc. Pt is comfortable with above plan and is stable for discharge at this time. All questions were answered prior to disposition. Strict return precautions for f/u to the ED were discussed.   I personally performed the services described in this documentation, which was scribed in my presence. The recorded information has been reviewed and is accurate.   Final Clinical Impressions(s) / ED Diagnoses   Final diagnoses:  Left hip pain    New Prescriptions New Prescriptions   TRAMADOL (ULTRAM) 50 MG TABLET    Take 1 tablet (50 mg total) by mouth every 6 (six) hours as needed.     Rise Mu, PA-C 11/18/16 1650    Gerhard Munch, MD 11/18/16 2029

## 2016-11-18 NOTE — ED Triage Notes (Signed)
Pt reports left hip pain and lower back pain that radiates down her leg. She does report she fell 2 weeks ago onto left hip but states she has been having constant pain to this hip "for a very long time." Ambulatory with steady gait.

## 2016-11-18 NOTE — ED Notes (Signed)
Pt called x3 for room -- found outside smoking at street. Gait was ok walking into hospital, pt began walking with a limp once realized she was going to a room.

## 2017-03-22 ENCOUNTER — Emergency Department (HOSPITAL_COMMUNITY)
Admission: EM | Admit: 2017-03-22 | Discharge: 2017-03-23 | Disposition: A | Discharge status: Home / Self Care | Payer: Self-pay | Attending: Emergency Medicine | Admitting: Emergency Medicine

## 2017-03-22 ENCOUNTER — Encounter (HOSPITAL_COMMUNITY): Payer: Self-pay

## 2017-03-22 DIAGNOSIS — M6281 Muscle weakness (generalized): Secondary | ICD-10-CM | POA: Insufficient documentation

## 2017-03-22 DIAGNOSIS — Z79899 Other long term (current) drug therapy: Secondary | ICD-10-CM | POA: Insufficient documentation

## 2017-03-22 DIAGNOSIS — F17210 Nicotine dependence, cigarettes, uncomplicated: Secondary | ICD-10-CM | POA: Insufficient documentation

## 2017-03-22 DIAGNOSIS — R29898 Other symptoms and signs involving the musculoskeletal system: Secondary | ICD-10-CM

## 2017-03-22 DIAGNOSIS — G8929 Other chronic pain: Secondary | ICD-10-CM

## 2017-03-22 DIAGNOSIS — R51 Headache: Secondary | ICD-10-CM | POA: Insufficient documentation

## 2017-03-22 NOTE — ED Triage Notes (Signed)
Pt was seen in ED 11-18-16 for left hip injury d/t falling, since then pt has been experiencing blurred vision, walking into objects, balance and perception issues (getting up pt gets dizzy and loses balance), constant headaches (takes Goody powders, hydrocodone, flexeril, benadryl), tongue lag, concentration problems.  [facility] follow up of these issues.  Pt was prescribed Flexeril and was told to wait three months, if no imiprovement f/u ED.

## 2017-03-23 ENCOUNTER — Emergency Department (HOSPITAL_COMMUNITY): Payer: Self-pay

## 2017-03-23 MED ORDER — METOCLOPRAMIDE HCL 5 MG/ML IJ SOLN
10.0000 mg | Freq: Once | INTRAMUSCULAR | Status: AC
  Administered 2017-03-23: 10 mg via INTRAVENOUS
  Filled 2017-03-23: qty 2

## 2017-03-23 MED ORDER — DEXAMETHASONE SODIUM PHOSPHATE 10 MG/ML IJ SOLN
10.0000 mg | Freq: Once | INTRAMUSCULAR | Status: AC
  Administered 2017-03-23: 10 mg via INTRAVENOUS
  Filled 2017-03-23: qty 1

## 2017-03-23 MED ORDER — DIPHENHYDRAMINE HCL 50 MG/ML IJ SOLN
12.5000 mg | Freq: Once | INTRAMUSCULAR | Status: AC
  Administered 2017-03-23: 12.5 mg via INTRAVENOUS
  Filled 2017-03-23: qty 1

## 2017-03-23 NOTE — ED Provider Notes (Signed)
[facility]PT Provider Note   CSN: [id number] Arrival date & time: 03/22/17  1952     History   Chief Complaint Chief Complaint  Patient presents with  . Blurred Vision  . Dizziness    HPI Stacy Joyce is a 56 y.o. female.  Patient presents with complaint of frontal headache that is a daily pain she has had since a fall in January of this year. Cause of fall was unknown. No syncope at that time or since that time. She reports multiple falls since January and associates these with dizziness and unsteady gait. She has persistent pain in her left hip and this causes painful ambulation but states she feels off balance and often has to hold on to something while walking to prevent another fall. Her vision is intermittently blurred. No vomiting.    The history is provided by the patient. No language interpreter was used.    Past Medical History:  Diagnosis Date  . Chronic back pain   . Endometriosis   . Fibromyalgia   . Herpes    no outbreak in 5 years, gets fever blisters and lesions on buttocks  . Post traumatic stress disorder (PTSD)   . Precancerous changes of the cervix   . Shingles     Patient Active Problem List   Diagnosis Date Noted  . Annual physical exam 09/06/2015  . Back pain, chronic 09/06/2015    Past Surgical History:  Procedure Laterality Date  . ABDOMINAL HYSTERECTOMY    . TUBAL LIGATION      OB History    Gravida Para Term Preterm AB Living   5 3 3   2 3    SAB TAB Ectopic Multiple Live Births   2               Home Medications    Prior to Admission medications   Medication Sig Start Date End Date Taking? Authorizing Provider  diazepam (VALIUM) 5 MG tablet Take 5 mg by mouth 3 (three) times daily as needed. Anxiety    [provider]  HYDROcodone-acetaminophen (NORCO) 7.5-325 MG tablet Take 1 tablet by mouth every 6 (six) hours as needed for moderate pain. 09/06/15   [name], MD  ibuprofen (ADVIL,MOTRIN) 200 MG  tablet Take 400 mg by mouth every 6 (six) hours as needed. Pain    [provider]  polyethylene glycol (MIRALAX / GLYCOLAX) packet Take 17 g by mouth daily as needed. constipation    [provider]  Soft Lens Products (SENSITIVE EYES SALINE) SOLN Place 1 drop into both eyes daily as needed. Dry Eyes    [provider]  traMADol (ULTRAM) 50 MG tablet Take 1 tablet (50 mg total) by mouth every 6 (six) hours as needed. 11/18/16   Rise Mu, PA-C    Family History Family History  Problem Relation Age of Onset  . Diabetes Father   . Heart failure Father   . Alzheimer's disease Mother   . Cancer Other     Social History Social History  Substance Use Topics  . Smoking status: Current Every Day Smoker    Packs/day: 1.00    Years: 46.00    Types: Cigarettes  . Smokeless tobacco: Never Used     Comment: Pt used to smoke 2-3 packs a day but now only smokes 1 a day  . Alcohol use No     Comment: occ.     Allergies   Patient has no known allergies.  Review of Systems Review of Systems  Constitutional: Negative for chills and fever.  HENT: Negative.   Eyes: Positive for visual disturbance.  Respiratory: Negative.  Negative for shortness of breath.   Cardiovascular: Negative.  Negative for chest pain.  Gastrointestinal: Negative.  Negative for abdominal pain and nausea.  Musculoskeletal: Positive for gait problem.  Skin: Negative.  Negative for wound.  Neurological: Positive for dizziness, weakness and headaches. Negative for syncope, facial asymmetry and speech difficulty.     Physical Exam Updated Vital Signs BP 124/85   Pulse 71   Temp 98 F (36.7 C) (Oral)   Resp 16   Ht 5\' 4"  (1.626 m)   Wt 77.6 kg   SpO2 97%   BMI 29.35 kg/m   Physical Exam  Constitutional: She is oriented to person, place, and time. She appears well-developed and well-nourished.  HENT:  Head: Normocephalic.  Eyes: Pupils are equal, round, and reactive to  light.  Neck: Normal range of motion. Neck supple.  Cardiovascular: Normal rate and regular rhythm.   No murmur heard. Pulmonary/Chest: Effort normal and breath sounds normal. She has no wheezes. She has no rales.  Abdominal: Soft. Bowel sounds are normal. There is no tenderness. There is no rebound and no guarding.  Musculoskeletal: Normal range of motion.  Neurological: She is alert and oriented to person, place, and time.  CN's 3-12 grossly intact. Speech is clear and focused. No facial asymmetry. Reflexes are unequal - brisk on left, decreased on right. No deficits of coordination. Ambulatory without imbalance. Strength: left UE weak on grip compared to right. Left LE mildly weaker when compared to the right.    Skin: Skin is warm and dry. No rash noted.  Psychiatric: She has a normal mood and affect.     ED Treatments / Results  Labs (all labs ordered are listed, but only abnormal results are displayed) Labs Reviewed - No data to display  EKG  EKG Interpretation None       Radiology Ct Head Wo Contrast  Result Date: 03/23/2017 CLINICAL DATA:  Ataxia with headache and unsteady gait x3 months EXAM: CT HEAD WITHOUT CONTRAST TECHNIQUE: Contiguous axial images were obtained from the base of the skull through the vertex without intravenous contrast. COMPARISON:  None. FINDINGS: Brain: No evidence of acute infarction, hemorrhage, hydrocephalus, extra-axial collection or mass lesion/mass effect. Minimal small vessel ischemic changes periventricular white matter No effacement of the basal cisterns. The fourth ventricle is midline. Vascular: No hyperdense vessel or unexpected calcification. Skull: Normal. Negative for fracture or focal lesion. Sinuses/Orbits: No acute finding. Other: None. IMPRESSION: Minimal small vessel ischemic disease. No acute intracranial abnormality. Electronically Signed   By: Tollie Eth M.D.   On: 03/23/2017 01:13    Procedures Procedures (including critical  care time)  Medications Ordered in ED Medications  metoCLOPramide (REGLAN) injection 10 mg (10 mg Intravenous Given 03/23/17 0113)  diphenhydrAMINE (BENADRYL) injection 12.5 mg (12.5 mg Intravenous Given 03/23/17 0113)  dexamethasone (DECADRON) injection 10 mg (10 mg Intravenous Given 03/23/17 0113)     Initial Impression / Assessment and Plan / ED Course  I have reviewed the triage vital signs and the nursing notes.  Pertinent labs & imaging results that were available during my care of the patient were reviewed by me and considered in my medical decision making (see chart for details).     The patient presents with weakness, dizziness, off balance, falls and headache since fall in January of this year (x 5 months).  She states it gets worse over time but no acute changes today. She was waiting on evaluation until her daughter's wedding, which was yesterday.   She is ambulatory in the department without ataxia. There is weakness lateralizing to the left of UE greater than LE. CT head was normal.   Discussed with Dr. Amada JupiterKirkpatrick (neurology) who agrees that the patient can continue evaluation of symptoms on an outpatient basis. Will refer to neurology. Headache is resolved with Reglan, Benadryl and Decadron.  Final Clinical Impressions(s) / ED Diagnoses   Final diagnoses:  None   1. Headache 2. Lateralized weakness, left   New Prescriptions New Prescriptions   No medications on file     Elpidio AnisUpstill, Shaunte Tuft, Cordelia Poche-C 03/23/17 [id number]    Gilda CreasePollina, Christopher J, MD 03/23/17 [phone]

## 2017-03-23 NOTE — Discharge Instructions (Signed)
IT IS IMPORTANT THAT YOU FOLLOW UP WITH NEUROLOGY FOR FURTHER EVALUATION OF LEFT SIDED WEAKNESS AND RECURRENT HEADACHE. PLEASE CALL THEIR OFFICE AND LET THEM KNOW YOU WERE REFERRED BY THE EMERGENCY DEPARTMENT.

## 2017-11-16 ENCOUNTER — Other Ambulatory Visit (HOSPITAL_COMMUNITY): Payer: Self-pay | Admitting: Nurse Practitioner

## 2017-11-16 DIAGNOSIS — M25552 Pain in left hip: Secondary | ICD-10-CM

## 2017-11-17 ENCOUNTER — Other Ambulatory Visit (HOSPITAL_COMMUNITY): Payer: Self-pay | Admitting: *Deleted

## 2017-11-17 ENCOUNTER — Ambulatory Visit (HOSPITAL_COMMUNITY)
Admission: RE | Admit: 2017-11-17 | Discharge: 2017-11-17 | Disposition: A | Discharge status: Home / Self Care | Payer: Self-pay | Source: Ambulatory Visit | Attending: Nurse Practitioner | Admitting: Nurse Practitioner

## 2017-11-17 ENCOUNTER — Other Ambulatory Visit (HOSPITAL_COMMUNITY): Payer: Self-pay | Admitting: Nurse Practitioner

## 2017-11-17 DIAGNOSIS — Q7649 Other congenital malformations of spine, not associated with scoliosis: Secondary | ICD-10-CM | POA: Insufficient documentation

## 2017-11-17 DIAGNOSIS — M25552 Pain in left hip: Secondary | ICD-10-CM

## 2017-12-14 ENCOUNTER — Encounter: Payer: Self-pay | Admitting: Diagnostic Neuroimaging

## 2017-12-14 ENCOUNTER — Ambulatory Visit (INDEPENDENT_AMBULATORY_CARE_PROVIDER_SITE_OTHER): Payer: Self-pay | Admitting: Diagnostic Neuroimaging

## 2017-12-14 VITALS — BP 116/73 | HR 76 | Ht 64.0 in | Wt 191.2 lb

## 2017-12-14 DIAGNOSIS — G43009 Migraine without aura, not intractable, without status migrainosus: Secondary | ICD-10-CM

## 2017-12-14 DIAGNOSIS — M79605 Pain in left leg: Secondary | ICD-10-CM

## 2017-12-14 DIAGNOSIS — I69852 Hemiplegia and hemiparesis following other cerebrovascular disease affecting left dominant side: Secondary | ICD-10-CM

## 2017-12-14 DIAGNOSIS — R29898 Other symptoms and signs involving the musculoskeletal system: Secondary | ICD-10-CM

## 2017-12-14 NOTE — Patient Instructions (Signed)
HEADACHES - MRI brain  - consider: start topiramate 50mg  at bedtime; after 1 week increase to twice a day; drink plenty of water - consider: rizatriptan 10mg  as needed for breakthrough headache; may repeat x 1 after 2 hours; max 2 tabs per day or 8 per month - gradually reduce BC powder and caffeine (Mtn Dew)  LEFT HIP / LOW BACK PAIN - consider PT / OT evaluation
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
## 2017-12-17 ENCOUNTER — Telehealth: Payer: Self-pay | Admitting: *Deleted

## 2017-12-17 ENCOUNTER — Ambulatory Visit (HOSPITAL_COMMUNITY)
Admission: RE | Admit: 2017-12-17 | Discharge: 2017-12-17 | Disposition: A | Discharge status: Home / Self Care | Payer: Self-pay | Source: Ambulatory Visit | Attending: Diagnostic Neuroimaging | Admitting: Diagnostic Neuroimaging

## 2017-12-17 DIAGNOSIS — I69852 Hemiplegia and hemiparesis following other cerebrovascular disease affecting left dominant side: Secondary | ICD-10-CM | POA: Insufficient documentation

## 2017-12-17 NOTE — Telephone Encounter (Addendum)
Spoke with patient and informed her that her MRI brain result is unremarkable. She questioned why she has weakness, numbness. This RN advised that there is no evidence in her MRI for causes for weakness. Reviewed Dr Richrd HumblesPenumalli's recommendations re: PT/OT. Also advised she continue to consider migraine/headache medications, reduce BC powder and caffeine. Patient stated she is cutting back on caffeine. She requested [facility] where she sees Vertis Kelchebra Allen, NP.  Patient stated if she goes on medication she can get it there for less cost.  This RN advised she call if she has any questions, problems or decides to have PT/OT or new Rx prior to FU in 6 months. She verbalized understanding, appreciation for call.  Patient had also stated she no longer sees Dr Quitman LivingsSami Hassan as PCP. She sees Vertis Kelchebra Allen NP [facility]  Office note faxed.

## 2017-12-21 ENCOUNTER — Ambulatory Visit (HOSPITAL_COMMUNITY): Payer: Self-pay

## 2018-05-05 ENCOUNTER — Other Ambulatory Visit: Payer: Self-pay | Admitting: Obstetrics and Gynecology

## 2018-05-18 ENCOUNTER — Other Ambulatory Visit (HOSPITAL_COMMUNITY): Payer: Self-pay | Admitting: *Deleted

## 2018-05-18 DIAGNOSIS — Z1231 Encounter for screening mammogram for malignant neoplasm of breast: Secondary | ICD-10-CM

## 2018-05-26 ENCOUNTER — Ambulatory Visit (HOSPITAL_COMMUNITY)
Admission: RE | Admit: 2018-05-26 | Discharge: 2018-05-26 | Disposition: A | Discharge status: Home / Self Care | Payer: PRIVATE HEALTH INSURANCE | Source: Ambulatory Visit | Attending: *Deleted | Admitting: *Deleted

## 2018-05-26 DIAGNOSIS — Z1231 Encounter for screening mammogram for malignant neoplasm of breast: Secondary | ICD-10-CM | POA: Diagnosis present

## 2018-05-28 ENCOUNTER — Other Ambulatory Visit (HOSPITAL_COMMUNITY): Payer: Self-pay | Admitting: *Deleted

## 2018-05-28 DIAGNOSIS — IMO0002 Reserved for concepts with insufficient information to code with codable children: Secondary | ICD-10-CM

## 2018-05-28 DIAGNOSIS — R229 Localized swelling, mass and lump, unspecified: Principal | ICD-10-CM

## 2018-06-02 ENCOUNTER — Ambulatory Visit: Payer: Self-pay | Admitting: Diagnostic Neuroimaging

## 2018-06-03 ENCOUNTER — Encounter: Payer: Self-pay | Admitting: Diagnostic Neuroimaging

## 2018-06-08 ENCOUNTER — Ambulatory Visit (HOSPITAL_COMMUNITY)
Admission: RE | Admit: 2018-06-08 | Discharge: 2018-06-08 | Disposition: A | Discharge status: Home / Self Care | Payer: PRIVATE HEALTH INSURANCE | Source: Ambulatory Visit | Attending: *Deleted | Admitting: *Deleted

## 2018-06-08 ENCOUNTER — Encounter (HOSPITAL_COMMUNITY): Payer: Self-pay

## 2018-06-08 DIAGNOSIS — R229 Localized swelling, mass and lump, unspecified: Secondary | ICD-10-CM | POA: Insufficient documentation

## 2018-06-08 DIAGNOSIS — IMO0002 Reserved for concepts with insufficient information to code with codable children: Secondary | ICD-10-CM

## 2019-09-12 IMAGING — MR MR HIP*L* W/O CM
5 series · 37 of 40 positions shown · non-contrast
Comparison: Radiographs 11/18/2016 and CT scan from 03/23/2012

CLINICAL DATA: Left hip pain for 1 year

EXAM:
MR OF THE LEFT HIP WITHOUT CONTRAST
TECHNIQUE: Multiplanar, multisequence MR imaging was performed. No intravenous
contrast was administered.

[Series 2: t1_ir_cor · coronal · 4.0mm · 1.09mm/px · 9 of 24 slices shown]
[im 1/24]
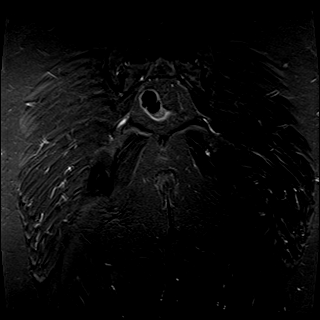
[im 3/24]
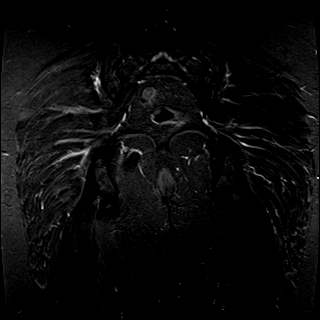
[im 6/24]
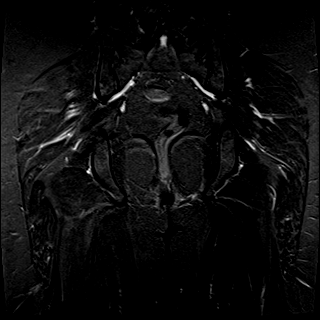
[im 9/24]
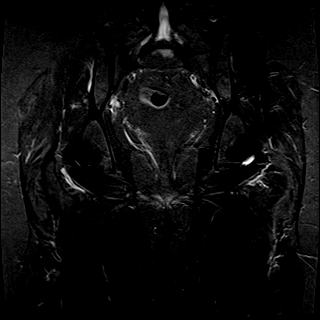
[im 12/24]
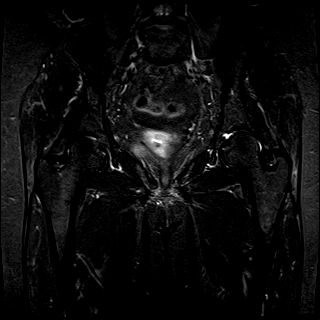
[im 15/24]
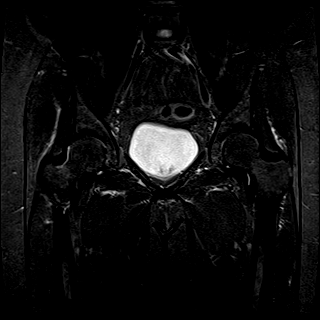
[im 18/24]
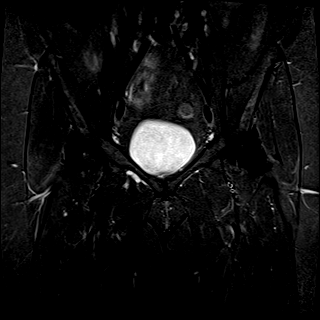
[im 21/24]
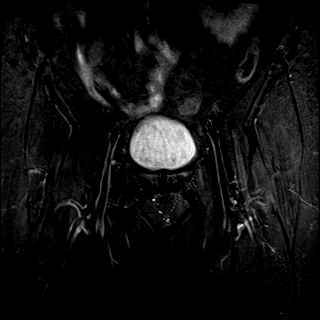
[im 24/24]
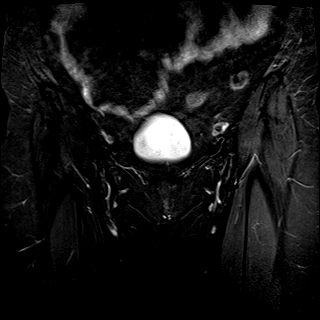

[Series 3: t1_tse_cor · coronal · 4.0mm · 0.91mm/px · 8 of 24 slices shown]
[im 1/24]
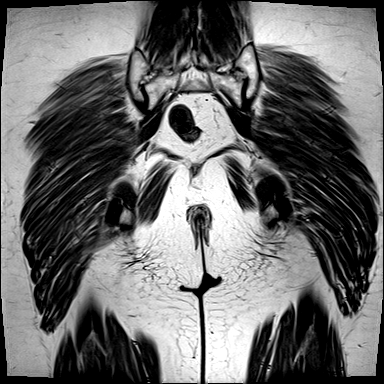
[im 4/24]
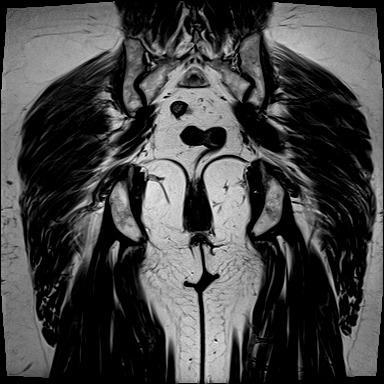
[im 7/24]
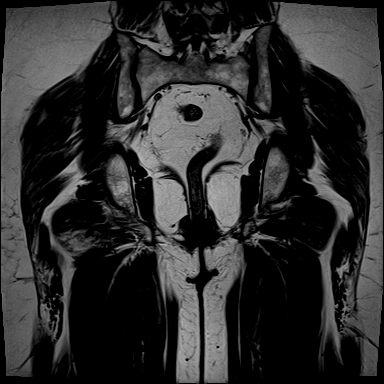
[im 10/24]
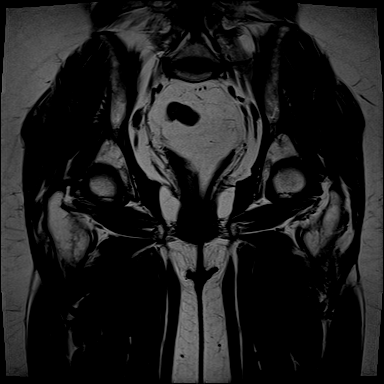
[im 14/24]
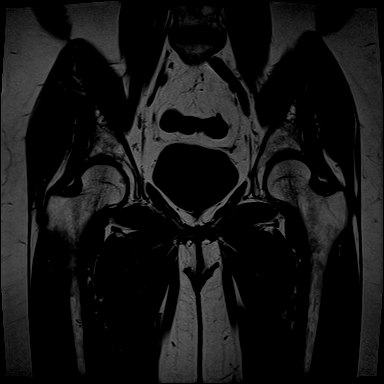
[im 17/24]
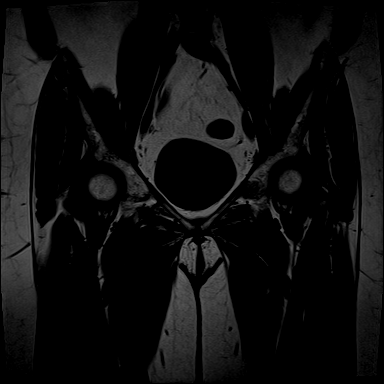
[im 20/24]
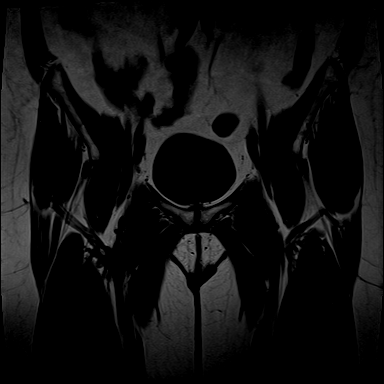
[im 24/24]
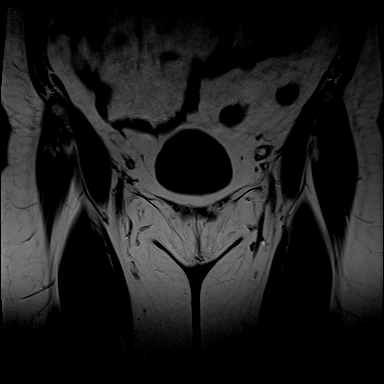

[Series 4: t2fs_tse_tra · axial · 4.0mm · 1.09mm/px · z∈[-96,+15]mm · 8 of 24 slices shown]
[im 1/24]
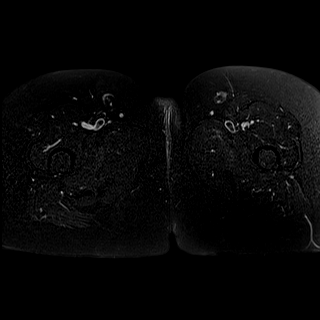
[im 4/24]
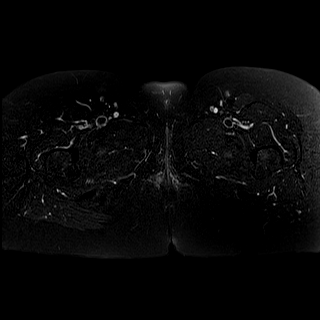
[im 7/24]
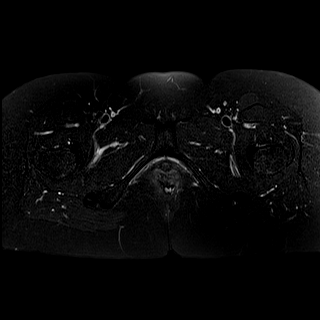
[im 10/24]
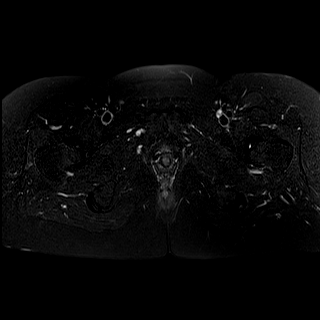
[im 14/24]
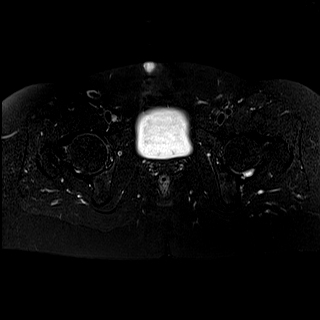
[im 17/24]
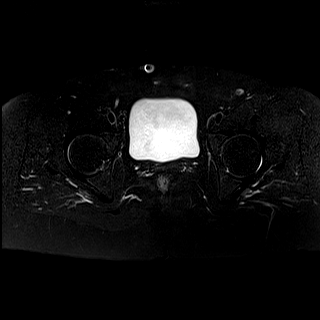
[im 20/24]
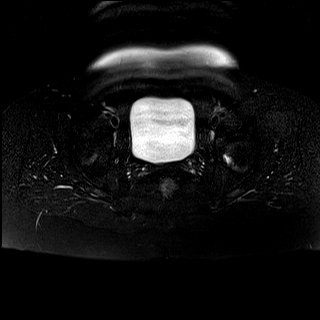
[im 24/24]
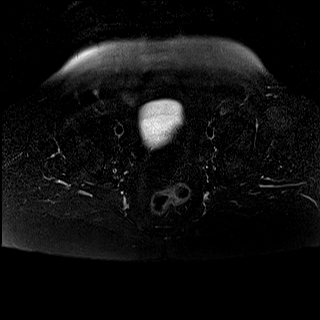

[Series 5: t1_tse_tra · axial · 4.0mm · 1.09mm/px · z∈[-96,+15]mm · 8 of 24 slices shown]
[im 1/24]
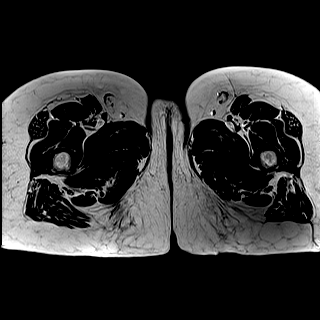
[im 4/24]
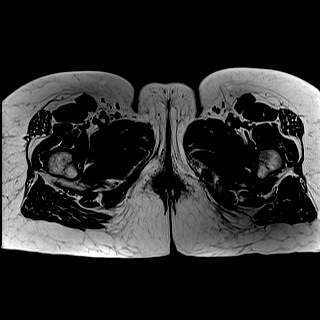
[im 7/24]
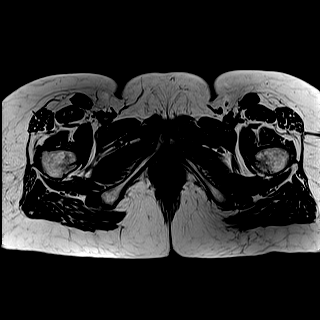
[im 10/24]
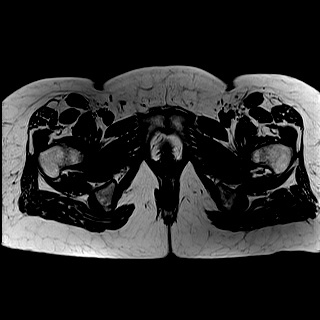
[im 14/24]
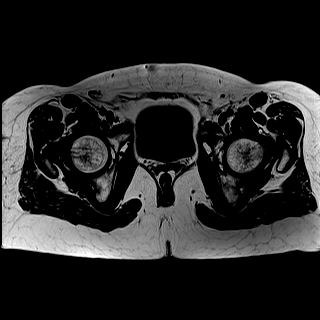
[im 17/24]
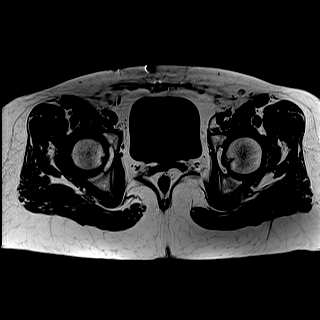
[im 20/24]
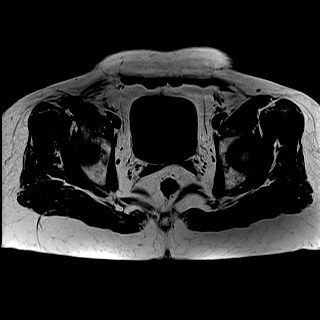
[im 24/24]
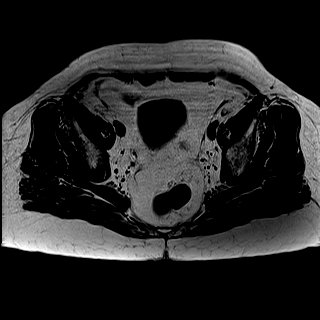

[Series 6: pd_tse_fs_sag · sagittal · 4.0mm · 0.27mm/px · 4 of 22 slices shown]
[im 1/22]
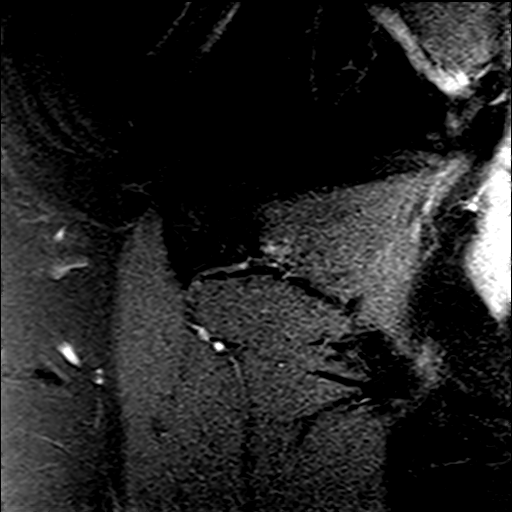
[im 4/22]
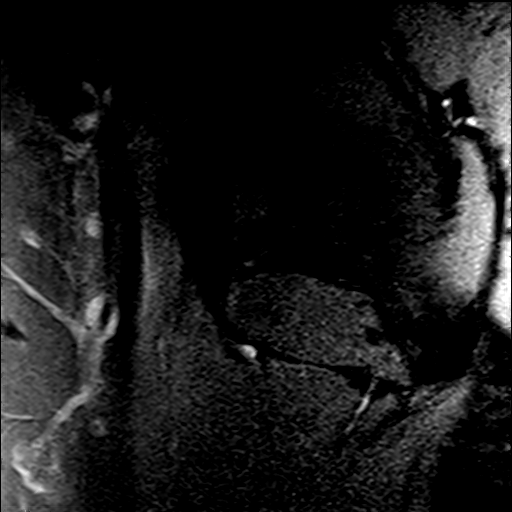
[im 8/22]
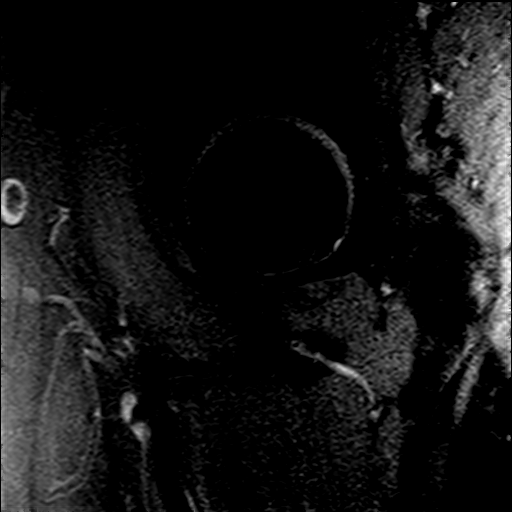
[im 11/22]
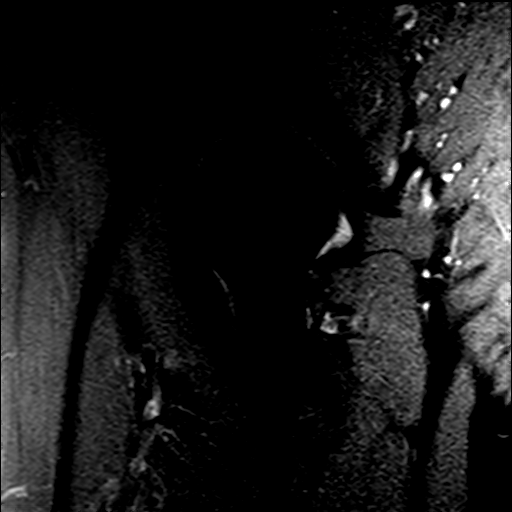

[37 of 40 positions shown; findings below may reference images not displayed]

FINDINGS: Bones: Transitional S1 vertebra with broad left transverse process
pseudo articulating with the rest of the sacrum. This can cause
Bertolotti syndrome, but I do not show any edema at the
pseudoarticulation. There is some evidence of spondylosis and
degenerative disc disease at the L5-S1 level which would be better
characterized with dedicated lumbar spine imaging.

Articular cartilage and labrum

Articular cartilage:  Unremarkable

Labrum:  Grossly unremarkable.  No paralabral cyst.

Joint or bursal effusion

Joint effusion:  Absent

Bursae:  No regional bursitis

Muscles and tendons

Muscles and tendons:  Unremarkable

Other findings

Miscellaneous:  Uterus absent.
IMPRESSION: 1. No specific findings to account for the patient's left hip pain.
However, among possible contributors, there does appear to be some
spondylosis and degenerative disc disease at L5-S1. Moreover, there
is a transitional S1 vertebra with broad left transverse process
which pseudo articulates with the sacrum. This can cause Bertolotti
syndrome (although on today's exam, no marrow edema is present along
the pseudoarticulation to further suggest Bertolotti syndrome).

## 2020-01-14 ENCOUNTER — Ambulatory Visit: Payer: Self-pay | Attending: Internal Medicine

## 2023-02-05 ENCOUNTER — Other Ambulatory Visit: Payer: Self-pay | Admitting: Internal Medicine

## 2023-02-05 DIAGNOSIS — Z1231 Encounter for screening mammogram for malignant neoplasm of breast: Secondary | ICD-10-CM

## 2023-05-25 ENCOUNTER — Other Ambulatory Visit: Payer: Self-pay | Admitting: Internal Medicine

## 2023-05-25 DIAGNOSIS — N644 Mastodynia: Secondary | ICD-10-CM

## 2023-06-10 ENCOUNTER — Ambulatory Visit: Admission: RE | Admit: 2023-06-10 | Payer: Medicaid Other | Source: Ambulatory Visit

## 2023-06-10 ENCOUNTER — Other Ambulatory Visit: Payer: Self-pay | Admitting: Internal Medicine

## 2023-06-10 ENCOUNTER — Ambulatory Visit
Admission: RE | Admit: 2023-06-10 | Discharge: 2023-06-10 | Disposition: A | Discharge status: Home / Self Care | Payer: Self-pay | Source: Ambulatory Visit | Attending: Internal Medicine | Admitting: Internal Medicine

## 2023-06-10 DIAGNOSIS — N644 Mastodynia: Secondary | ICD-10-CM

## 2023-12-22 ENCOUNTER — Ambulatory Visit: Payer: Medicaid Other | Admitting: Podiatry

## 2023-12-29 ENCOUNTER — Ambulatory Visit: Payer: Medicaid Other | Admitting: Podiatry

## 2023-12-31 ENCOUNTER — Ambulatory Visit: Payer: Medicaid Other | Admitting: Podiatry

## 2024-01-07 ENCOUNTER — Other Ambulatory Visit: Payer: Self-pay | Admitting: Podiatry

## 2024-01-07 ENCOUNTER — Ambulatory Visit (INDEPENDENT_AMBULATORY_CARE_PROVIDER_SITE_OTHER): Payer: Medicaid Other | Admitting: Podiatry

## 2024-01-07 ENCOUNTER — Ambulatory Visit (INDEPENDENT_AMBULATORY_CARE_PROVIDER_SITE_OTHER): Payer: Medicaid Other

## 2024-01-07 DIAGNOSIS — M79672 Pain in left foot: Secondary | ICD-10-CM

## 2024-01-07 DIAGNOSIS — M79671 Pain in right foot: Secondary | ICD-10-CM | POA: Diagnosis not present

## 2024-01-07 DIAGNOSIS — G5793 Unspecified mononeuropathy of bilateral lower limbs: Secondary | ICD-10-CM

## 2024-01-07 NOTE — Patient Instructions (Signed)
 VISIT SUMMARY:  Rilda, today we discussed your foot pain and possible neuropathy, as well as your history of ingrown toenails and back pain. We have outlined a plan to address each of these issues and will follow up as needed.  YOUR PLAN:  -FOOT PAIN AND POSSIBLE NEUROPATHY: Neuropathy is a condition where the nerves are damaged, causing pain, numbness, and other symptoms. We will check your B12 and folate levels to see if a deficiency might be causing your symptoms. Additionally, we recommend seeing a back surgeon to check for any nerve compression that could be contributing to your pain. If your symptoms continue or get worse, we may consider nerve conduction studies to further investigate.  -INGROWN TOENAILS: Ingrown toenails occur when the edges of the nail grow into the surrounding skin, causing pain and discomfort. We discussed proper nail care techniques, including cleaning and safe trimming. If your symptoms do not improve, we can perform an in-office procedure to remove the problematic parts of the nail. Please seek immediate care if you notice any signs of infection, such as redness, swelling, or pus.  -BACK PAIN: Back pain can be caused by various factors, including muscle strain, nerve issues, or other underlying conditions. We recommend consulting with a back surgeon to explore possible surgical options. For now, continue with your current pain management plan as prescribed by your primary care provider.  INSTRUCTIONS:  Please follow up with the recommended consultations: a back surgeon for both your foot pain and back pain. Additionally, get your B12 and folate levels checked as ordered. If your symptoms persist or worsen, we may need to conduct further tests such as nerve conduction studies. Continue with your current pain management regimen and seek immediate care if you notice any signs of infection in your toenails.

## 2024-01-07 NOTE — Progress Notes (Signed)
 Subjective:  Patient ID: Stacy Joyce, female    DOB: [birth year],  MRN: [id number]  Chief Complaint  Patient presents with   Foot Pain    Bilateral foot pain  Pt stated that the bottom of her feet all the time She stated that she also has some discomfort with both great toenails     Discussed the use of AI scribe software for clinical note transcription with the patient, who gave verbal consent to proceed.  History of Present Illness   Stacy Joyce is a 63 year old female who presents with foot pain and suspected neuropathy.  She experiences significant foot pain described as stabbing and burning, particularly severe in the mornings. She likens the sensation to having 'walked across the desert with no shoes on.' Additionally, she has pins and needles sensations, numbness, and cold feelings in her feet, especially at night. No prior diagnosis of neuropathy has been made.  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 She is uncertain if this incident is related to her current foot issues.  She has a history of ingrown toenails, which she manages by clipping. Her toenails are thick and sensitive, particularly on the sides. She used to find pedicures helpful in managing her toenail issues.  No symptoms in her fingers. She denies any current pain when pressure is applied to her feet. She has low back pain and hip issues. She is on a low dose of hydrocodone for pain management. She has not had any surgeries on her back but has received an injection in her hip.  Her mother had low vitamin B levels, which she mentions in the context of discussing potential causes of neuropathy.  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          Objective:    Physical Exam   EXTREMITIES: Decreased light touch sensation on  plantar surface of foot and toes. Good smooth range of motion of subtalar, metatarsal phalangeal, and midtarsal joints. Pescavous foot type with no acute C7 abnormalities. Incorrect nail width with no signs of infection.       No images are attached to the encounter.    Results   RADIOLOGY Foot X-ray: Pes cavus foot type with no acute abnormalities. Bone structure and joints are unremarkable. (01/07/2024)      Assessment:   1. Pain in both feet   2. Neuropathy of both feet      Plan:  Patient was evaluated and treated and all questions answered.  Assessment and Plan    Foot Pain and Possible Neuropathy Patient reports stabbing and burning pain in feet, with intermittent numbness and pins and needles sensation. No prior diagnosis of neuropathy. Possible contributing factors include history of back and hip pain, and previous trauma to toenails. -Order B12 and folate levels to rule out deficiency causing neuropathy. -Recommend consultation with a back surgeon to evaluate for possible nerve compression contributing to symptoms. -Consider nerve conduction studies if symptoms persist or worsen.  Ingrown Toenails Patient has history of trauma to toenails, with current discomfort and difficulty with self-care. No signs of infection noted on examination. -Advise patient on proper nail care, including cleaning and safe trimming techniques. -Offer in-office procedure to remove problematic portions of toenail if symptoms persist or worsen. -Advise patient to seek immediate care if signs of infection develop.  Back Pain Patient reports ongoing back pain, currently managed with low-dose hydrocodone by primary care provider. -Recommend consultation with a back surgeon to evaluate for possible surgical interventions. -Continue current pain management regimen as prescribed by primary care provider.          Return if symptoms worsen or fail to improve.

## 2024-01-08 LAB — VITAMIN B12: Vitamin B-12: 322 pg/mL (ref 200–1100)

## 2024-06-03 ENCOUNTER — Ambulatory Visit: Admitting: Podiatry

## 2024-08-03 NOTE — Therapy (Incomplete)
 OUTPATIENT PHYSICAL THERAPY THORACOLUMBAR EVALUATION   Patient Name: Stacy Joyce MRN: [id number] DOB:October 31, 1961, 63 y.o., female Today's Date: 08/03/2024  END OF SESSION:   Past Medical History:  Diagnosis Date   Arthritis    Chronic back pain    Endometriosis    Fibromyalgia    Herpes    no outbreak in 5 years, gets fever blisters and lesions on buttocks   Post traumatic stress disorder (PTSD)    Precancerous changes of the cervix    Shingles    Past Surgical History:  Procedure Laterality Date   ABDOMINAL HYSTERECTOMY  1993   partial in 1993, complete 1995   TUBAL LIGATION  1991   Patient Active Problem List   Diagnosis Date Noted   Annual physical exam 09/06/2015   Back pain, chronic 09/06/2015    PCP: Dasie Lew, NP  REFERRING PROVIDER: Faison, Alphonza, NPFaison, Alphonza, NP  REFERRING DIAG: pain in lumbar spine  Rationale for Evaluation and Treatment: Rehabilitation  THERAPY DIAG:  No diagnosis found.  ONSET DATE: ***  SUBJECTIVE:                                                                                                                                                                                           SUBJECTIVE STATEMENT: ***  PERTINENT HISTORY:  ***  PAIN:  Are you having pain? {OPRCPAIN:27236}  PRECAUTIONS: {Therapy precautions:24002}  RED FLAGS: {PT Red Flags:29287}   WEIGHT BEARING RESTRICTIONS: {Yes ***/No:24003}  FALLS:  Has patient fallen in last 6 months? {fallsyesno:27318}  LIVING ENVIRONMENT: Lives with: {OPRC lives with:25569::lives with their family} Lives in: {Lives in:25570} Stairs: {opstairs:27293} Has following equipment at home: {Assistive devices:23999}  OCCUPATION: ***  PLOF: {PLOF:24004}  PATIENT GOALS: ***  NEXT MD VISIT: ***  OBJECTIVE:  Note: Objective measures were completed at Evaluation unless otherwise noted.  DIAGNOSTIC FINDINGS:  ***  PATIENT SURVEYS:  Modified Oswestry:   MODIFIED OSWESTRY DISABILITY SCALE  Date: *** Score  Pain intensity {ODI 1:32962}  2. Personal care (washing, dressing, etc.) {ODI 2:32963}  3. Lifting {ODI 3:32964}  4. Walking {ODI 4:32965}  5. Sitting {ODI 5:32966}  6. Standing {ODI 6:32967}  7. Sleeping {ODI 7:32968}  8. Social Life {ODI 8:32969}  9. Traveling {ODI 9:32970}  10. Employment/ Homemaking {ODI 10:32971}  Total ***/50   Interpretation of scores: Score Category Description  0-20% Minimal Disability The patient can cope with most living activities. Usually no treatment is indicated apart from advice on lifting, sitting and exercise  21-40% Moderate Disability The patient experiences more pain and difficulty with sitting, lifting and standing. Travel and social life are more difficult  and they may be disabled from work. Personal care, sexual activity and sleeping are not grossly affected, and the patient can usually be managed by conservative means  41-60% Severe Disability Pain remains the main problem in this group, but activities of daily living are affected. These patients require a detailed investigation  61-80% Crippled Back pain impinges on all aspects of the patient's life. Positive intervention is required  81-100% Bed-bound  These patients are either bed-bound or exaggerating their symptoms  Bluford FORBES Zoe DELENA Karon DELENA, et al. Surgery versus conservative management of stable thoracolumbar fracture: the PRESTO feasibility RCT. [place] ([place]): [employer]; 2021 Nov. [facility] Technology Assessment, No. 25.62.) Appendix 3, Oswestry Disability Index category descriptors. Available from: [employer]cz  Minimally Clinically Important Difference (MCID) = 12.8%  COGNITION: Overall cognitive status: {cognition:24006}     SENSATION: {sensation:27233}  MUSCLE LENGTH: Hamstrings: Right *** deg; Left *** deg Debby test: Right *** deg; Left *** deg  POSTURE:  {posture:25561}  PALPATION: ***  LUMBAR ROM:   AROM eval  Flexion   Extension   Right lateral flexion   Left lateral flexion   Right rotation   Left rotation    (Blank rows = not tested)  LOWER EXTREMITY ROM:     {AROM/PROM:27142}  Right eval Left eval  Hip flexion    Hip extension    Hip abduction    Hip adduction    Hip internal rotation    Hip external rotation    Knee flexion    Knee extension    Ankle dorsiflexion    Ankle plantarflexion    Ankle inversion    Ankle eversion     (Blank rows = not tested)  LOWER EXTREMITY MMT:    MMT Right eval Left eval  Hip flexion    Hip extension    Hip abduction    Hip adduction    Hip internal rotation    Hip external rotation    Knee flexion    Knee extension    Ankle dorsiflexion    Ankle plantarflexion    Ankle inversion    Ankle eversion     (Blank rows = not tested)  LUMBAR SPECIAL TESTS:  {lumbar special test:25242}  FUNCTIONAL TESTS:  {Functional tests:24029}  GAIT: Distance walked: *** Assistive device utilized: {Assistive devices:23999} Level of assistance: {Levels of assistance:24026} Comments: ***  TREATMENT DATE: 08/04/24 physical therapy evaluation and HEP instruction                                                                                                                                 PATIENT EDUCATION:  Education details: Patient educated on exam findings, POC, scope of PT, HEP, and ***. Person educated: Patient Education method: Explanation, Demonstration, and Handouts Education comprehension: verbalized understanding, returned demonstration, verbal cues required, and tactile cues required   HOME EXERCISE PROGRAM: ***  ASSESSMENT:  CLINICAL IMPRESSION: Patient is a 63 y.o. female who was  seen today for physical therapy evaluation and treatment for pain in lumbar spinepain in lumbar spine.   OBJECTIVE IMPAIRMENTS: {opptimpairments:25111}.   ACTIVITY LIMITATIONS:  {activitylimitations:27494}  PARTICIPATION LIMITATIONS: {participationrestrictions:25113}  PERSONAL FACTORS: {Personal factors:25162} are also affecting patient's functional outcome.   REHAB POTENTIAL: Good  CLINICAL DECISION MAKING: Evolving/moderate complexity  EVALUATION COMPLEXITY: Moderate   GOALS: Goals reviewed with patient? No  SHORT TERM GOALS: Target date: ***  patient will be independent with initial HEP  Baseline: Goal status: INITIAL  2.  Patient will report 30% improvement overall  Baseline:  Goal status: INITIAL  3.  *** Baseline:  Goal status: INITIAL  4.  *** Baseline:  Goal status: INITIAL  5.  *** Baseline:  Goal status: INITIAL  6.  *** Baseline:  Goal status: INITIAL  LONG TERM GOALS: Target date: ***  Patient will be independent in self management strategies to improve quality of life and functional outcomes.  Baseline:  Goal status: INITIAL  2.  Patient will report 50% improvement overall  Baseline:  Goal status: INITIAL  3.  *** Baseline:  Goal status: INITIAL  4.  *** Baseline:  Goal status: INITIAL  5.  *** Baseline:  Goal status: INITIAL  6.  *** Baseline:  Goal status: INITIAL  PLAN:  PT FREQUENCY: {rehab frequency:25116}  PT DURATION: {rehab duration:25117}  PLANNED INTERVENTIONS: 97164- PT Re-evaluation, 97110-Therapeutic exercises, 97530- Therapeutic activity, 97112- Neuromuscular re-education, 97535- Self Care, 02859- Manual therapy, [id number]- Gait training, [id number]- Orthotic Fit/training, [id number]- Canalith repositioning, [id number]- Aquatic Therapy, [id number]- Splinting, [id number]- Wound care (first 20 sq cm), 97598- Wound care (each additional 20 sq cm)Patient/Family education, Balance training, Stair training, Taping, Dry Needling, Joint mobilization, Joint manipulation, Spinal manipulation, Spinal mobilization, Scar mobilization, and DME instructions. SABRA  PLAN FOR NEXT SESSION: Review HEP and goals   10:26 AM,  08/04/24 Royston Bekele Small Dedric Ethington MPT [place] [facility] [place] [phone]

## 2024-08-04 ENCOUNTER — Telehealth (HOSPITAL_COMMUNITY): Payer: Self-pay

## 2024-08-04 ENCOUNTER — Ambulatory Visit (HOSPITAL_COMMUNITY)

## 2024-08-04 NOTE — Therapy (Unsigned)
 OUTPATIENT PHYSICAL THERAPY THORACOLUMBAR EVALUATION   Patient Name: Stacy Joyce MRN: [id number] DOB:07-01-1961, 63 y.o., female Today's Date: 08/04/2024  END OF SESSION:   Past Medical History:  Diagnosis Date   Arthritis    Chronic back pain    Endometriosis    Fibromyalgia    Herpes    no outbreak in 5 years, gets fever blisters and lesions on buttocks   Post traumatic stress disorder (PTSD)    Precancerous changes of the cervix    Shingles    Past Surgical History:  Procedure Laterality Date   ABDOMINAL HYSTERECTOMY  1993   partial in 1993, complete 1995   TUBAL LIGATION  1991   Patient Active Problem List   Diagnosis Date Noted   Annual physical exam 09/06/2015   Back pain, chronic 09/06/2015    PCP: ***  REFERRING PROVIDER: Luvenia Badder, NP  REFERRING DIAG:  pain in lumbar spine    Rationale for Evaluation and Treatment: Rehabilitation  THERAPY DIAG:  No diagnosis found.  ONSET DATE: ***  SUBJECTIVE:                                                                                                                                                                                           SUBJECTIVE STATEMENT: ***  PERTINENT HISTORY:  ***  PAIN:  Are you having pain? {OPRCPAIN:27236}  PRECAUTIONS: {Therapy precautions:24002}  RED FLAGS: {PT Red Flags:29287}   WEIGHT BEARING RESTRICTIONS: {Yes ***/No:24003}  FALLS:  Has patient fallen in last 6 months? {fallsyesno:27318}  LIVING ENVIRONMENT: Lives with: {OPRC lives with:25569::lives with their family} Lives in: {Lives in:25570} Stairs: {opstairs:27293} Has following equipment at home: {Assistive devices:23999}  OCCUPATION: ***  PLOF: {PLOF:24004}  PATIENT GOALS: ***  NEXT MD VISIT: ***  OBJECTIVE:  Note: Objective measures were completed at Evaluation unless otherwise noted.  DIAGNOSTIC FINDINGS:  ***  PATIENT SURVEYS:  {rehab surveys:24030}  COGNITION: Overall  cognitive status: {cognition:24006}     SENSATION: {sensation:27233}  MUSCLE LENGTH: Hamstrings: Right *** deg; Left *** deg Debby test: Right *** deg; Left *** deg  POSTURE: {posture:25561}  PALPATION: ***  LUMBAR ROM:   AROM eval  Flexion   Extension   Right lateral flexion   Left lateral flexion   Right rotation   Left rotation    (Blank rows = not tested)  LOWER EXTREMITY ROM:     {AROM/PROM:27142}  Right eval Left eval  Hip flexion    Hip extension    Hip abduction    Hip adduction    Hip internal rotation    Hip external rotation    Knee flexion  Knee extension    Ankle dorsiflexion    Ankle plantarflexion    Ankle inversion    Ankle eversion     (Blank rows = not tested)  LOWER EXTREMITY MMT:    MMT Right eval Left eval  Hip flexion    Hip extension    Hip abduction    Hip adduction    Hip internal rotation    Hip external rotation    Knee flexion    Knee extension    Ankle dorsiflexion    Ankle plantarflexion    Ankle inversion    Ankle eversion     (Blank rows = not tested)  LUMBAR SPECIAL TESTS:  {lumbar special test:25242}  FUNCTIONAL TESTS:  {Functional tests:24029}  GAIT: Distance walked: *** Assistive device utilized: {Assistive devices:23999} Level of assistance: {Levels of assistance:24026} Comments: ***  TREATMENT DATE: ***                                                                                                                                 PATIENT EDUCATION:  Education details: *** Person educated: {Person educated:25204} Education method: {Education Method:25205} Education comprehension: {Education Comprehension:25206}  HOME EXERCISE PROGRAM: ***  ASSESSMENT:  CLINICAL IMPRESSION: Patient is a *** y.o. *** who was seen today for physical therapy evaluation and treatment for ***.   OBJECTIVE IMPAIRMENTS: {opptimpairments:25111}.   ACTIVITY LIMITATIONS:  {activitylimitations:27494}  PARTICIPATION LIMITATIONS: {participationrestrictions:25113}  PERSONAL FACTORS: {Personal factors:25162} are also affecting patient's functional outcome.   REHAB POTENTIAL: {rehabpotential:25112}  CLINICAL DECISION MAKING: {clinical decision making:25114}  EVALUATION COMPLEXITY: {Evaluation complexity:25115}   GOALS: Goals reviewed with patient? {yes/no:20286}  SHORT TERM GOALS: Target date: ***  *** Baseline: Goal status: INITIAL  2.  *** Baseline:  Goal status: INITIAL  3.  *** Baseline:  Goal status: INITIAL  4.  *** Baseline:  Goal status: INITIAL  5.  *** Baseline:  Goal status: INITIAL  6.  *** Baseline:  Goal status: INITIAL  LONG TERM GOALS: Target date: ***  *** Baseline:  Goal status: INITIAL  2.  *** Baseline:  Goal status: INITIAL  3.  *** Baseline:  Goal status: INITIAL  4.  *** Baseline:  Goal status: INITIAL  5.  *** Baseline:  Goal status: INITIAL  6.  *** Baseline:  Goal status: INITIAL  PLAN:  PT FREQUENCY: {rehab frequency:25116}  PT DURATION: {rehab duration:25117}  PLANNED INTERVENTIONS: {rehab planned interventions:25118::97110-Therapeutic exercises,97530- Therapeutic [phone]- Neuromuscular re-education,97535- Self Rjmz,02859- Manual therapy,Patient/Family education}.  PLAN FOR NEXT SESSION: ***   Rosaria BRAVO Powell-Butler, PT 08/04/2024, 6:46 PM

## 2024-08-04 NOTE — Telephone Encounter (Signed)
 Called; mailbox is full and unable to leave a message.  3:26 PM, 08/04/24 Denna Fryberger Small Eathen Budreau MPT [place] [facility] [place] [phone]

## 2024-08-05 ENCOUNTER — Ambulatory Visit (HOSPITAL_COMMUNITY)

## 2024-08-31 ENCOUNTER — Ambulatory Visit (HOSPITAL_COMMUNITY)

## 2024-09-27 ENCOUNTER — Ambulatory Visit (HOSPITAL_COMMUNITY)

## 2025-01-06 ENCOUNTER — Ambulatory Visit (HOSPITAL_COMMUNITY)
